# Patient Record
Sex: Male | Born: 1935 | Race: White | Hispanic: No | Marital: Married | Smoking: Former smoker
Health system: Southern US, Community
[De-identification: ages and names within clinical notes are randomized; demographics above are authoritative.]

## PROBLEM LIST (undated history)

## (undated) DIAGNOSIS — K219 Gastro-esophageal reflux disease without esophagitis: Secondary | ICD-10-CM

## (undated) DIAGNOSIS — L409 Psoriasis, unspecified: Secondary | ICD-10-CM

## (undated) DIAGNOSIS — M1990 Unspecified osteoarthritis, unspecified site: Secondary | ICD-10-CM

## (undated) DIAGNOSIS — E669 Obesity, unspecified: Secondary | ICD-10-CM

## (undated) DIAGNOSIS — R079 Chest pain, unspecified: Secondary | ICD-10-CM

## (undated) DIAGNOSIS — I10 Essential (primary) hypertension: Secondary | ICD-10-CM

## (undated) DIAGNOSIS — I200 Unstable angina: Secondary | ICD-10-CM

## (undated) DIAGNOSIS — E785 Hyperlipidemia, unspecified: Secondary | ICD-10-CM

## (undated) HISTORY — DX: Psoriasis, unspecified: L40.9

## (undated) HISTORY — DX: Chest pain, unspecified: R07.9

## (undated) HISTORY — DX: Unstable angina: I20.0

## (undated) HISTORY — DX: Hyperlipidemia, unspecified: E78.5

## (undated) HISTORY — DX: Unspecified osteoarthritis, unspecified site: M19.90

## (undated) HISTORY — PX: CHOLECYSTECTOMY: SHX55

## (undated) HISTORY — DX: Essential (primary) hypertension: I10

## (undated) HISTORY — DX: Gastro-esophageal reflux disease without esophagitis: K21.9

## (undated) HISTORY — DX: Obesity, unspecified: E66.9

---

## 1972-08-13 HISTORY — PX: HERNIA REPAIR: SHX51

## 2004-06-19 HISTORY — PX: OTHER SURGICAL HISTORY: SHX169

## 2004-06-22 ENCOUNTER — Inpatient Hospital Stay (HOSPITAL_COMMUNITY): Admission: RE | Admit: 2004-06-22 | Discharge: 2004-06-23 | Payer: Self-pay | Admitting: Cardiology

## 2004-06-22 HISTORY — PX: CARDIAC CATHETERIZATION: SHX172

## 2004-06-29 ENCOUNTER — Ambulatory Visit (HOSPITAL_COMMUNITY): Admission: RE | Admit: 2004-06-29 | Discharge: 2004-06-30 | Payer: Self-pay | Admitting: Cardiovascular Disease

## 2005-11-19 IMAGING — CR DG CHEST 2V
2 series · 2 of 2 positions shown · non-contrast
Comparison: none

CLINICAL DATA: precardiac catheterization; chest pain; hypertension
 PA AND LATERAL CHEST
 Heart size and vascularity are normal.  There are some calcified granulomas in the left lung and left hilum.  The thoracic aorta is slightly tortuous.  Small nodular densities at both bases are felt to represent nipple shadows.
 IMPRESSION
 No acute abnormality.

[view not recorded (1 of 2)]
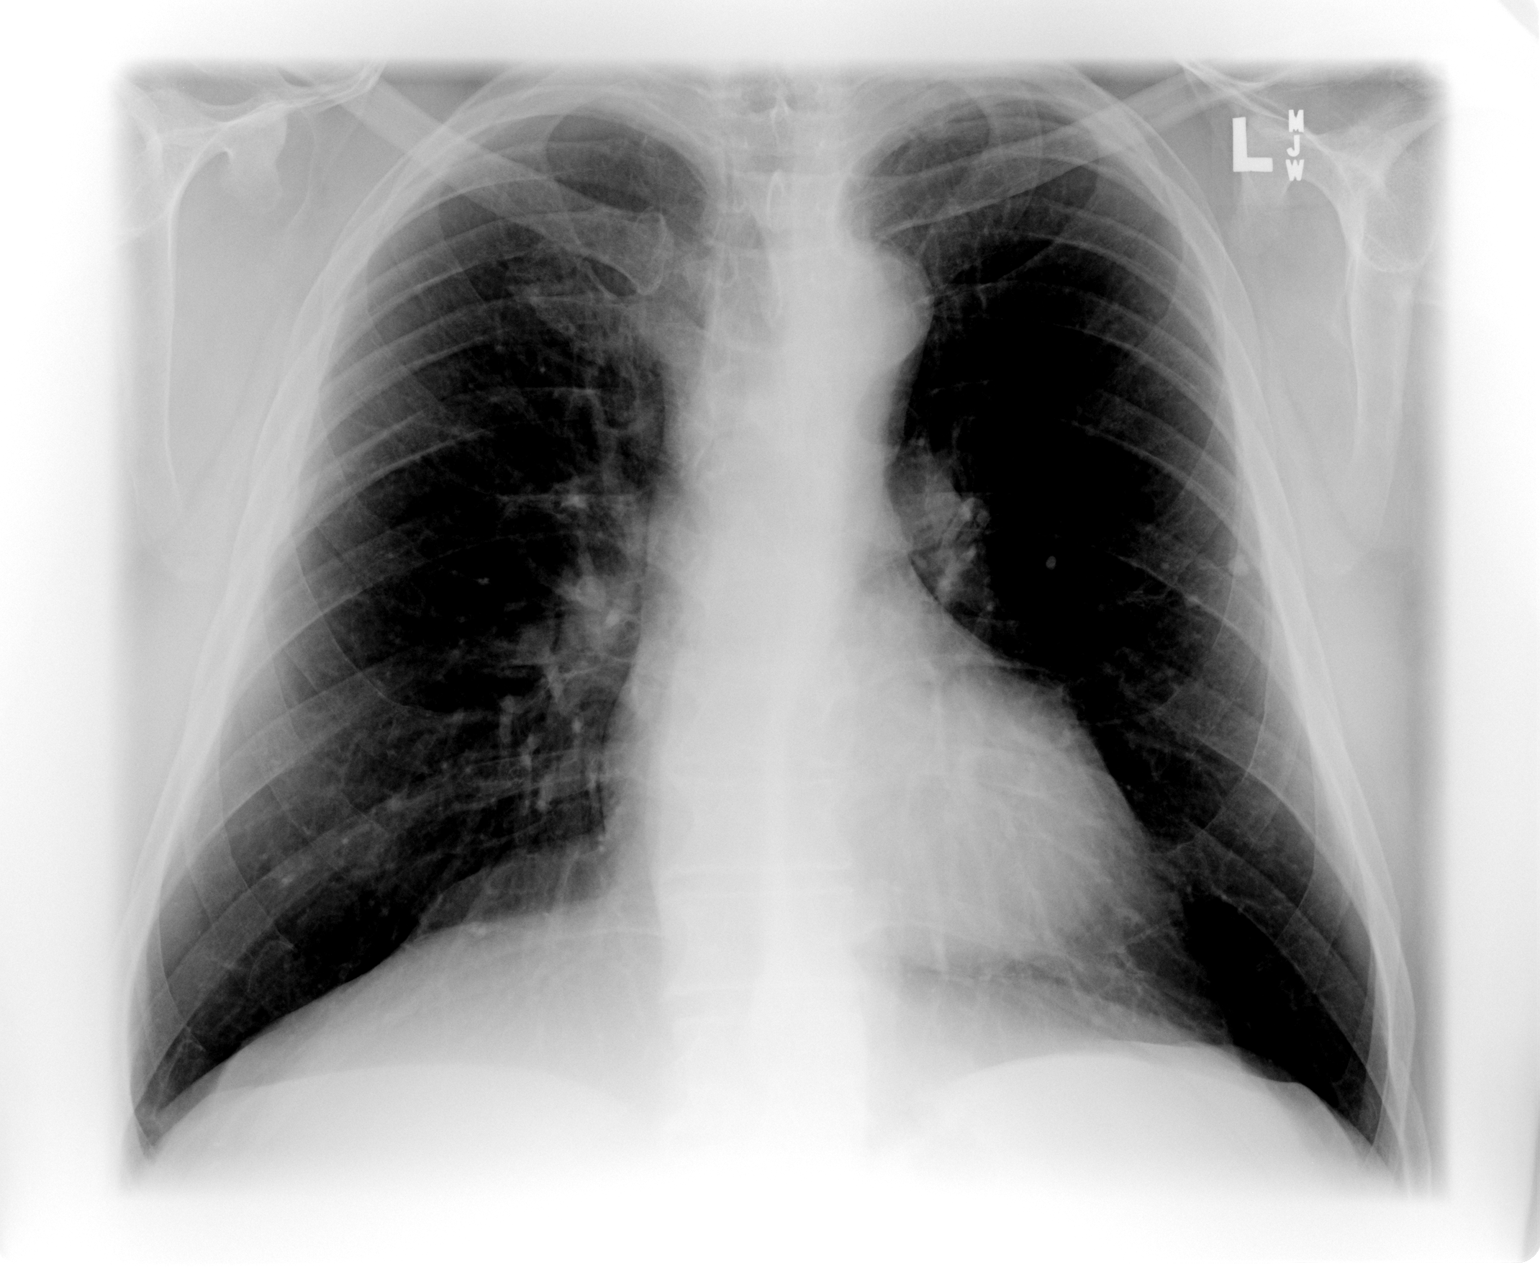

[view not recorded (2 of 2)]
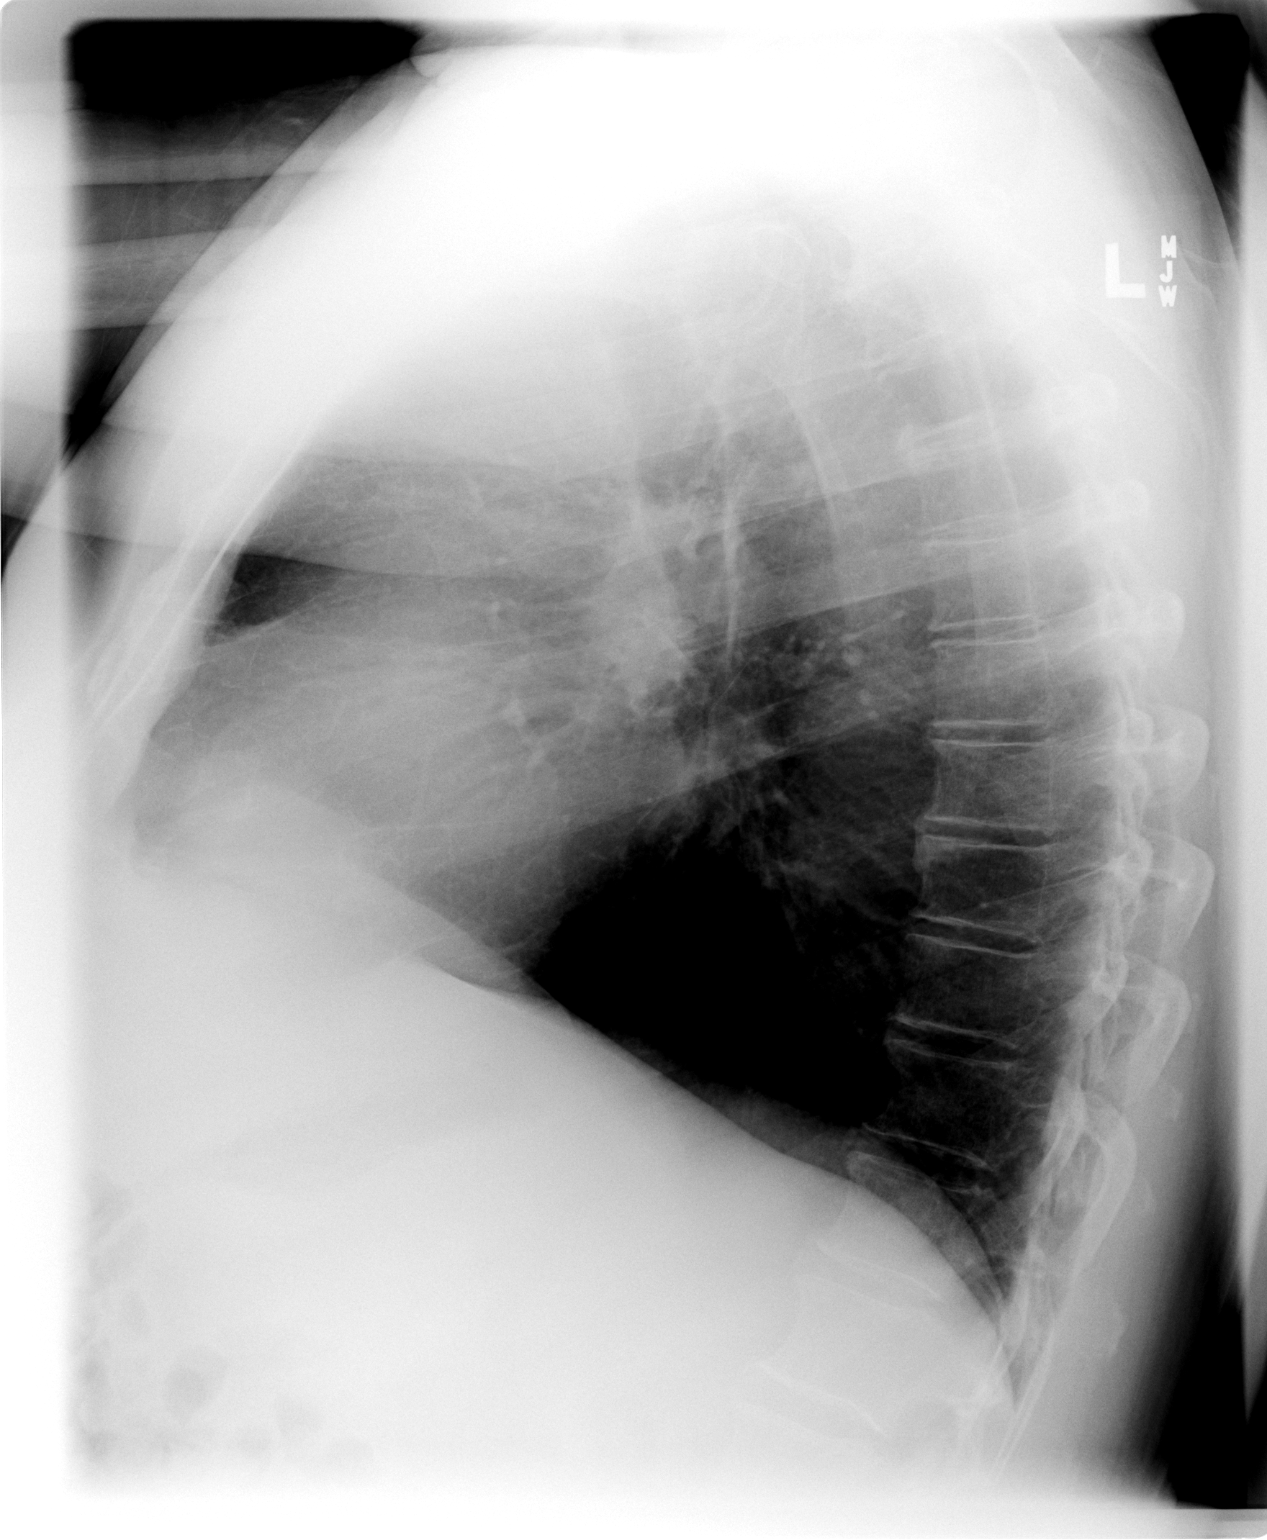

[2 of 2 positions shown; findings below may reference images not displayed]

## 2011-03-20 HISTORY — PX: DOPPLER ECHOCARDIOGRAPHY: SHX263

## 2011-08-02 HISTORY — PX: CARDIOVASCULAR STRESS TEST: SHX262

## 2011-09-17 DIAGNOSIS — D500 Iron deficiency anemia secondary to blood loss (chronic): Secondary | ICD-10-CM | POA: Diagnosis not present

## 2011-12-11 DIAGNOSIS — D500 Iron deficiency anemia secondary to blood loss (chronic): Secondary | ICD-10-CM | POA: Diagnosis not present

## 2012-01-01 DIAGNOSIS — K7689 Other specified diseases of liver: Secondary | ICD-10-CM | POA: Diagnosis not present

## 2012-01-01 DIAGNOSIS — D500 Iron deficiency anemia secondary to blood loss (chronic): Secondary | ICD-10-CM | POA: Diagnosis not present

## 2012-01-02 DIAGNOSIS — D500 Iron deficiency anemia secondary to blood loss (chronic): Secondary | ICD-10-CM | POA: Diagnosis not present

## 2012-01-04 DIAGNOSIS — K7689 Other specified diseases of liver: Secondary | ICD-10-CM | POA: Diagnosis not present

## 2012-01-24 DIAGNOSIS — Z79899 Other long term (current) drug therapy: Secondary | ICD-10-CM | POA: Diagnosis not present

## 2012-01-24 DIAGNOSIS — E782 Mixed hyperlipidemia: Secondary | ICD-10-CM | POA: Diagnosis not present

## 2012-01-24 DIAGNOSIS — R6889 Other general symptoms and signs: Secondary | ICD-10-CM | POA: Diagnosis not present

## 2012-01-24 DIAGNOSIS — R5381 Other malaise: Secondary | ICD-10-CM | POA: Diagnosis not present

## 2012-01-24 DIAGNOSIS — I2510 Atherosclerotic heart disease of native coronary artery without angina pectoris: Secondary | ICD-10-CM | POA: Diagnosis not present

## 2012-01-24 DIAGNOSIS — I719 Aortic aneurysm of unspecified site, without rupture: Secondary | ICD-10-CM | POA: Diagnosis not present

## 2012-01-24 DIAGNOSIS — I739 Peripheral vascular disease, unspecified: Secondary | ICD-10-CM | POA: Diagnosis not present

## 2012-02-18 DIAGNOSIS — I739 Peripheral vascular disease, unspecified: Secondary | ICD-10-CM | POA: Diagnosis not present

## 2012-02-18 DIAGNOSIS — I714 Abdominal aortic aneurysm, without rupture: Secondary | ICD-10-CM | POA: Diagnosis not present

## 2012-02-18 DIAGNOSIS — I2510 Atherosclerotic heart disease of native coronary artery without angina pectoris: Secondary | ICD-10-CM | POA: Diagnosis not present

## 2012-03-17 DIAGNOSIS — D500 Iron deficiency anemia secondary to blood loss (chronic): Secondary | ICD-10-CM | POA: Diagnosis not present

## 2012-03-18 DIAGNOSIS — D500 Iron deficiency anemia secondary to blood loss (chronic): Secondary | ICD-10-CM | POA: Diagnosis not present

## 2012-06-17 DIAGNOSIS — D500 Iron deficiency anemia secondary to blood loss (chronic): Secondary | ICD-10-CM | POA: Diagnosis not present

## 2012-06-20 DIAGNOSIS — K7460 Unspecified cirrhosis of liver: Secondary | ICD-10-CM | POA: Diagnosis not present

## 2012-07-01 DIAGNOSIS — R161 Splenomegaly, not elsewhere classified: Secondary | ICD-10-CM | POA: Diagnosis not present

## 2012-07-01 DIAGNOSIS — K7460 Unspecified cirrhosis of liver: Secondary | ICD-10-CM | POA: Diagnosis not present

## 2012-07-08 DIAGNOSIS — K7460 Unspecified cirrhosis of liver: Secondary | ICD-10-CM | POA: Diagnosis not present

## 2012-07-08 DIAGNOSIS — D500 Iron deficiency anemia secondary to blood loss (chronic): Secondary | ICD-10-CM | POA: Diagnosis not present

## 2012-07-08 DIAGNOSIS — R198 Other specified symptoms and signs involving the digestive system and abdomen: Secondary | ICD-10-CM | POA: Diagnosis not present

## 2012-07-29 DIAGNOSIS — I10 Essential (primary) hypertension: Secondary | ICD-10-CM | POA: Diagnosis not present

## 2012-07-29 DIAGNOSIS — E782 Mixed hyperlipidemia: Secondary | ICD-10-CM | POA: Diagnosis not present

## 2012-07-29 DIAGNOSIS — I2510 Atherosclerotic heart disease of native coronary artery without angina pectoris: Secondary | ICD-10-CM | POA: Diagnosis not present

## 2012-12-24 DIAGNOSIS — K7460 Unspecified cirrhosis of liver: Secondary | ICD-10-CM | POA: Diagnosis not present

## 2012-12-24 DIAGNOSIS — R161 Splenomegaly, not elsewhere classified: Secondary | ICD-10-CM | POA: Diagnosis not present

## 2012-12-31 DIAGNOSIS — K7460 Unspecified cirrhosis of liver: Secondary | ICD-10-CM | POA: Diagnosis not present

## 2012-12-31 DIAGNOSIS — D500 Iron deficiency anemia secondary to blood loss (chronic): Secondary | ICD-10-CM | POA: Diagnosis not present

## 2013-01-06 DIAGNOSIS — K219 Gastro-esophageal reflux disease without esophagitis: Secondary | ICD-10-CM | POA: Diagnosis not present

## 2013-01-06 DIAGNOSIS — K589 Irritable bowel syndrome without diarrhea: Secondary | ICD-10-CM | POA: Diagnosis not present

## 2013-01-06 DIAGNOSIS — D500 Iron deficiency anemia secondary to blood loss (chronic): Secondary | ICD-10-CM | POA: Diagnosis not present

## 2013-01-06 DIAGNOSIS — K7460 Unspecified cirrhosis of liver: Secondary | ICD-10-CM | POA: Diagnosis not present

## 2013-02-20 ENCOUNTER — Encounter: Payer: Self-pay | Admitting: Cardiovascular Disease

## 2013-02-20 DIAGNOSIS — E782 Mixed hyperlipidemia: Secondary | ICD-10-CM | POA: Diagnosis not present

## 2013-02-20 DIAGNOSIS — I2510 Atherosclerotic heart disease of native coronary artery without angina pectoris: Secondary | ICD-10-CM | POA: Diagnosis not present

## 2013-02-25 ENCOUNTER — Other Ambulatory Visit: Payer: Self-pay | Admitting: *Deleted

## 2013-02-25 DIAGNOSIS — R0989 Other specified symptoms and signs involving the circulatory and respiratory systems: Secondary | ICD-10-CM

## 2013-03-09 ENCOUNTER — Ambulatory Visit (HOSPITAL_COMMUNITY)
Admission: RE | Admit: 2013-03-09 | Discharge: 2013-03-09 | Disposition: A | Discharge status: Home / Self Care | Payer: Medicare Other | Source: Ambulatory Visit | Attending: Cardiovascular Disease | Admitting: Cardiovascular Disease

## 2013-03-09 DIAGNOSIS — R0989 Other specified symptoms and signs involving the circulatory and respiratory systems: Secondary | ICD-10-CM | POA: Diagnosis not present

## 2013-03-09 DIAGNOSIS — I6529 Occlusion and stenosis of unspecified carotid artery: Secondary | ICD-10-CM | POA: Diagnosis not present

## 2013-03-09 NOTE — Progress Notes (Signed)
Carotid Duplex Completed. Sherrill Mckamie, RDMS, RVT  

## 2013-07-06 DIAGNOSIS — K7460 Unspecified cirrhosis of liver: Secondary | ICD-10-CM | POA: Diagnosis not present

## 2013-07-06 DIAGNOSIS — D500 Iron deficiency anemia secondary to blood loss (chronic): Secondary | ICD-10-CM | POA: Diagnosis not present

## 2013-07-21 DIAGNOSIS — R195 Other fecal abnormalities: Secondary | ICD-10-CM | POA: Diagnosis not present

## 2013-07-21 DIAGNOSIS — K589 Irritable bowel syndrome without diarrhea: Secondary | ICD-10-CM | POA: Diagnosis not present

## 2013-07-21 DIAGNOSIS — K7460 Unspecified cirrhosis of liver: Secondary | ICD-10-CM | POA: Diagnosis not present

## 2013-07-21 DIAGNOSIS — D500 Iron deficiency anemia secondary to blood loss (chronic): Secondary | ICD-10-CM | POA: Diagnosis not present

## 2013-07-24 DIAGNOSIS — R161 Splenomegaly, not elsewhere classified: Secondary | ICD-10-CM | POA: Diagnosis not present

## 2013-07-24 DIAGNOSIS — K7460 Unspecified cirrhosis of liver: Secondary | ICD-10-CM | POA: Diagnosis not present

## 2013-09-02 DIAGNOSIS — D500 Iron deficiency anemia secondary to blood loss (chronic): Secondary | ICD-10-CM | POA: Diagnosis not present

## 2013-09-08 DIAGNOSIS — K7460 Unspecified cirrhosis of liver: Secondary | ICD-10-CM | POA: Diagnosis not present

## 2013-09-08 DIAGNOSIS — K920 Hematemesis: Secondary | ICD-10-CM | POA: Diagnosis not present

## 2013-09-08 DIAGNOSIS — I8500 Esophageal varices without bleeding: Secondary | ICD-10-CM | POA: Diagnosis not present

## 2013-09-08 DIAGNOSIS — D500 Iron deficiency anemia secondary to blood loss (chronic): Secondary | ICD-10-CM | POA: Diagnosis not present

## 2013-09-08 DIAGNOSIS — R195 Other fecal abnormalities: Secondary | ICD-10-CM | POA: Diagnosis not present

## 2013-10-05 DIAGNOSIS — I868 Varicose veins of other specified sites: Secondary | ICD-10-CM | POA: Diagnosis not present

## 2013-10-05 DIAGNOSIS — R195 Other fecal abnormalities: Secondary | ICD-10-CM | POA: Diagnosis not present

## 2013-10-05 DIAGNOSIS — I8500 Esophageal varices without bleeding: Secondary | ICD-10-CM | POA: Diagnosis not present

## 2013-10-06 DIAGNOSIS — R195 Other fecal abnormalities: Secondary | ICD-10-CM | POA: Diagnosis not present

## 2013-10-16 DIAGNOSIS — R195 Other fecal abnormalities: Secondary | ICD-10-CM | POA: Diagnosis not present

## 2013-10-19 DIAGNOSIS — R195 Other fecal abnormalities: Secondary | ICD-10-CM | POA: Diagnosis not present

## 2013-10-22 DIAGNOSIS — I8500 Esophageal varices without bleeding: Secondary | ICD-10-CM | POA: Diagnosis not present

## 2013-10-22 DIAGNOSIS — D500 Iron deficiency anemia secondary to blood loss (chronic): Secondary | ICD-10-CM | POA: Diagnosis not present

## 2013-10-22 DIAGNOSIS — R195 Other fecal abnormalities: Secondary | ICD-10-CM | POA: Diagnosis not present

## 2013-11-16 DIAGNOSIS — R195 Other fecal abnormalities: Secondary | ICD-10-CM | POA: Diagnosis not present

## 2013-11-18 DIAGNOSIS — R195 Other fecal abnormalities: Secondary | ICD-10-CM | POA: Diagnosis not present

## 2013-12-21 DIAGNOSIS — D500 Iron deficiency anemia secondary to blood loss (chronic): Secondary | ICD-10-CM | POA: Diagnosis not present

## 2013-12-21 DIAGNOSIS — R195 Other fecal abnormalities: Secondary | ICD-10-CM | POA: Diagnosis not present

## 2013-12-23 DIAGNOSIS — R195 Other fecal abnormalities: Secondary | ICD-10-CM | POA: Diagnosis not present

## 2014-01-22 DIAGNOSIS — R161 Splenomegaly, not elsewhere classified: Secondary | ICD-10-CM | POA: Diagnosis not present

## 2014-01-22 DIAGNOSIS — D500 Iron deficiency anemia secondary to blood loss (chronic): Secondary | ICD-10-CM | POA: Diagnosis not present

## 2014-01-22 DIAGNOSIS — Q619 Cystic kidney disease, unspecified: Secondary | ICD-10-CM | POA: Diagnosis not present

## 2014-01-22 DIAGNOSIS — K7460 Unspecified cirrhosis of liver: Secondary | ICD-10-CM | POA: Diagnosis not present

## 2014-01-28 DIAGNOSIS — R195 Other fecal abnormalities: Secondary | ICD-10-CM | POA: Diagnosis not present

## 2014-01-28 DIAGNOSIS — D500 Iron deficiency anemia secondary to blood loss (chronic): Secondary | ICD-10-CM | POA: Diagnosis not present

## 2014-01-29 DIAGNOSIS — R5383 Other fatigue: Secondary | ICD-10-CM | POA: Diagnosis not present

## 2014-01-29 DIAGNOSIS — D649 Anemia, unspecified: Secondary | ICD-10-CM | POA: Diagnosis not present

## 2014-01-29 DIAGNOSIS — I259 Chronic ischemic heart disease, unspecified: Secondary | ICD-10-CM | POA: Diagnosis not present

## 2014-01-29 DIAGNOSIS — R5381 Other malaise: Secondary | ICD-10-CM | POA: Diagnosis not present

## 2014-01-29 DIAGNOSIS — D539 Nutritional anemia, unspecified: Secondary | ICD-10-CM | POA: Diagnosis not present

## 2014-01-29 DIAGNOSIS — D696 Thrombocytopenia, unspecified: Secondary | ICD-10-CM | POA: Diagnosis not present

## 2014-01-29 DIAGNOSIS — Z79899 Other long term (current) drug therapy: Secondary | ICD-10-CM | POA: Diagnosis not present

## 2014-02-05 DIAGNOSIS — D510 Vitamin B12 deficiency anemia due to intrinsic factor deficiency: Secondary | ICD-10-CM | POA: Diagnosis not present

## 2014-02-11 DIAGNOSIS — D510 Vitamin B12 deficiency anemia due to intrinsic factor deficiency: Secondary | ICD-10-CM | POA: Diagnosis not present

## 2014-03-09 DIAGNOSIS — Z8249 Family history of ischemic heart disease and other diseases of the circulatory system: Secondary | ICD-10-CM | POA: Diagnosis not present

## 2014-03-09 DIAGNOSIS — I2789 Other specified pulmonary heart diseases: Secondary | ICD-10-CM | POA: Diagnosis not present

## 2014-03-09 DIAGNOSIS — L408 Other psoriasis: Secondary | ICD-10-CM | POA: Diagnosis not present

## 2014-03-09 DIAGNOSIS — K7460 Unspecified cirrhosis of liver: Secondary | ICD-10-CM | POA: Diagnosis not present

## 2014-03-09 DIAGNOSIS — I714 Abdominal aortic aneurysm, without rupture, unspecified: Secondary | ICD-10-CM | POA: Diagnosis not present

## 2014-03-09 DIAGNOSIS — I2510 Atherosclerotic heart disease of native coronary artery without angina pectoris: Secondary | ICD-10-CM | POA: Diagnosis not present

## 2014-03-09 DIAGNOSIS — I259 Chronic ischemic heart disease, unspecified: Secondary | ICD-10-CM | POA: Diagnosis not present

## 2014-03-09 DIAGNOSIS — I739 Peripheral vascular disease, unspecified: Secondary | ICD-10-CM | POA: Diagnosis not present

## 2014-03-11 DIAGNOSIS — I4891 Unspecified atrial fibrillation: Secondary | ICD-10-CM | POA: Diagnosis not present

## 2014-03-11 DIAGNOSIS — I8500 Esophageal varices without bleeding: Secondary | ICD-10-CM | POA: Diagnosis not present

## 2014-03-11 DIAGNOSIS — I2789 Other specified pulmonary heart diseases: Secondary | ICD-10-CM | POA: Diagnosis not present

## 2014-03-11 DIAGNOSIS — I2510 Atherosclerotic heart disease of native coronary artery without angina pectoris: Secondary | ICD-10-CM | POA: Diagnosis not present

## 2014-03-11 DIAGNOSIS — I259 Chronic ischemic heart disease, unspecified: Secondary | ICD-10-CM | POA: Diagnosis not present

## 2014-03-11 DIAGNOSIS — I739 Peripheral vascular disease, unspecified: Secondary | ICD-10-CM | POA: Diagnosis not present

## 2014-03-18 DIAGNOSIS — D510 Vitamin B12 deficiency anemia due to intrinsic factor deficiency: Secondary | ICD-10-CM | POA: Diagnosis not present

## 2014-03-22 DIAGNOSIS — I2789 Other specified pulmonary heart diseases: Secondary | ICD-10-CM | POA: Diagnosis not present

## 2014-03-22 DIAGNOSIS — I4891 Unspecified atrial fibrillation: Secondary | ICD-10-CM | POA: Diagnosis not present

## 2014-04-13 DIAGNOSIS — I2789 Other specified pulmonary heart diseases: Secondary | ICD-10-CM | POA: Diagnosis not present

## 2014-04-13 DIAGNOSIS — I8500 Esophageal varices without bleeding: Secondary | ICD-10-CM | POA: Diagnosis not present

## 2014-04-13 DIAGNOSIS — I4891 Unspecified atrial fibrillation: Secondary | ICD-10-CM | POA: Diagnosis not present

## 2014-04-13 DIAGNOSIS — I739 Peripheral vascular disease, unspecified: Secondary | ICD-10-CM | POA: Diagnosis not present

## 2014-04-13 DIAGNOSIS — K7460 Unspecified cirrhosis of liver: Secondary | ICD-10-CM | POA: Diagnosis not present

## 2014-04-13 DIAGNOSIS — I2510 Atherosclerotic heart disease of native coronary artery without angina pectoris: Secondary | ICD-10-CM | POA: Diagnosis not present

## 2014-04-16 DIAGNOSIS — D510 Vitamin B12 deficiency anemia due to intrinsic factor deficiency: Secondary | ICD-10-CM | POA: Diagnosis not present

## 2014-05-19 DIAGNOSIS — D509 Iron deficiency anemia, unspecified: Secondary | ICD-10-CM | POA: Diagnosis not present

## 2014-06-21 DIAGNOSIS — D509 Iron deficiency anemia, unspecified: Secondary | ICD-10-CM | POA: Diagnosis not present

## 2014-07-20 DIAGNOSIS — D509 Iron deficiency anemia, unspecified: Secondary | ICD-10-CM | POA: Diagnosis not present

## 2014-07-23 DIAGNOSIS — K7460 Unspecified cirrhosis of liver: Secondary | ICD-10-CM | POA: Diagnosis not present

## 2014-07-23 DIAGNOSIS — D509 Iron deficiency anemia, unspecified: Secondary | ICD-10-CM | POA: Diagnosis not present

## 2014-07-23 DIAGNOSIS — R161 Splenomegaly, not elsewhere classified: Secondary | ICD-10-CM | POA: Diagnosis not present

## 2014-07-27 DIAGNOSIS — D510 Vitamin B12 deficiency anemia due to intrinsic factor deficiency: Secondary | ICD-10-CM | POA: Diagnosis not present

## 2014-07-28 DIAGNOSIS — D519 Vitamin B12 deficiency anemia, unspecified: Secondary | ICD-10-CM | POA: Diagnosis not present

## 2014-07-28 DIAGNOSIS — L409 Psoriasis, unspecified: Secondary | ICD-10-CM | POA: Diagnosis not present

## 2014-07-28 DIAGNOSIS — R5383 Other fatigue: Secondary | ICD-10-CM | POA: Diagnosis not present

## 2014-07-28 DIAGNOSIS — K7460 Unspecified cirrhosis of liver: Secondary | ICD-10-CM | POA: Diagnosis not present

## 2014-07-28 DIAGNOSIS — R5381 Other malaise: Secondary | ICD-10-CM | POA: Diagnosis not present

## 2014-07-28 DIAGNOSIS — Z0000 Encounter for general adult medical examination without abnormal findings: Secondary | ICD-10-CM | POA: Diagnosis not present

## 2014-07-28 DIAGNOSIS — D509 Iron deficiency anemia, unspecified: Secondary | ICD-10-CM | POA: Diagnosis not present

## 2014-08-03 DIAGNOSIS — K219 Gastro-esophageal reflux disease without esophagitis: Secondary | ICD-10-CM | POA: Diagnosis not present

## 2014-08-03 DIAGNOSIS — D509 Iron deficiency anemia, unspecified: Secondary | ICD-10-CM | POA: Diagnosis not present

## 2014-08-03 DIAGNOSIS — K7460 Unspecified cirrhosis of liver: Secondary | ICD-10-CM | POA: Diagnosis not present

## 2014-08-03 DIAGNOSIS — K589 Irritable bowel syndrome without diarrhea: Secondary | ICD-10-CM | POA: Diagnosis not present

## 2014-08-25 DIAGNOSIS — D510 Vitamin B12 deficiency anemia due to intrinsic factor deficiency: Secondary | ICD-10-CM | POA: Diagnosis not present

## 2014-09-01 DIAGNOSIS — Z9884 Bariatric surgery status: Secondary | ICD-10-CM | POA: Diagnosis not present

## 2014-09-01 DIAGNOSIS — D62 Acute posthemorrhagic anemia: Secondary | ICD-10-CM | POA: Diagnosis not present

## 2014-09-01 DIAGNOSIS — Z86010 Personal history of colonic polyps: Secondary | ICD-10-CM | POA: Diagnosis not present

## 2014-09-01 DIAGNOSIS — Z87891 Personal history of nicotine dependence: Secondary | ICD-10-CM | POA: Diagnosis not present

## 2014-09-01 DIAGNOSIS — I4891 Unspecified atrial fibrillation: Secondary | ICD-10-CM | POA: Diagnosis not present

## 2014-09-01 DIAGNOSIS — K7290 Hepatic failure, unspecified without coma: Secondary | ICD-10-CM | POA: Diagnosis not present

## 2014-09-01 DIAGNOSIS — I8501 Esophageal varices with bleeding: Secondary | ICD-10-CM | POA: Diagnosis not present

## 2014-09-01 DIAGNOSIS — J189 Pneumonia, unspecified organism: Secondary | ICD-10-CM | POA: Diagnosis not present

## 2014-09-01 DIAGNOSIS — I8500 Esophageal varices without bleeding: Secondary | ICD-10-CM | POA: Diagnosis not present

## 2014-09-01 DIAGNOSIS — Z9861 Coronary angioplasty status: Secondary | ICD-10-CM | POA: Diagnosis not present

## 2014-09-01 DIAGNOSIS — R4182 Altered mental status, unspecified: Secondary | ICD-10-CM | POA: Diagnosis not present

## 2014-09-01 DIAGNOSIS — R531 Weakness: Secondary | ICD-10-CM | POA: Diagnosis not present

## 2014-09-01 DIAGNOSIS — K7460 Unspecified cirrhosis of liver: Secondary | ICD-10-CM | POA: Diagnosis not present

## 2014-09-01 DIAGNOSIS — R404 Transient alteration of awareness: Secondary | ICD-10-CM | POA: Diagnosis not present

## 2014-09-01 DIAGNOSIS — K7581 Nonalcoholic steatohepatitis (NASH): Secondary | ICD-10-CM | POA: Diagnosis present

## 2014-09-01 DIAGNOSIS — R55 Syncope and collapse: Secondary | ICD-10-CM | POA: Diagnosis not present

## 2014-09-01 DIAGNOSIS — I480 Paroxysmal atrial fibrillation: Secondary | ICD-10-CM | POA: Diagnosis not present

## 2014-09-01 DIAGNOSIS — Z95818 Presence of other cardiac implants and grafts: Secondary | ICD-10-CM | POA: Diagnosis not present

## 2014-09-01 DIAGNOSIS — Z0389 Encounter for observation for other suspected diseases and conditions ruled out: Secondary | ICD-10-CM | POA: Diagnosis not present

## 2014-09-01 DIAGNOSIS — L409 Psoriasis, unspecified: Secondary | ICD-10-CM | POA: Diagnosis present

## 2014-09-01 DIAGNOSIS — I10 Essential (primary) hypertension: Secondary | ICD-10-CM | POA: Diagnosis not present

## 2014-09-01 DIAGNOSIS — K922 Gastrointestinal hemorrhage, unspecified: Secondary | ICD-10-CM | POA: Diagnosis not present

## 2014-09-01 DIAGNOSIS — I2510 Atherosclerotic heart disease of native coronary artery without angina pectoris: Secondary | ICD-10-CM | POA: Diagnosis not present

## 2014-09-13 DIAGNOSIS — D649 Anemia, unspecified: Secondary | ICD-10-CM | POA: Diagnosis not present

## 2014-09-14 DIAGNOSIS — I8501 Esophageal varices with bleeding: Secondary | ICD-10-CM | POA: Diagnosis not present

## 2014-09-14 DIAGNOSIS — K7290 Hepatic failure, unspecified without coma: Secondary | ICD-10-CM | POA: Diagnosis not present

## 2014-09-14 DIAGNOSIS — K7460 Unspecified cirrhosis of liver: Secondary | ICD-10-CM | POA: Diagnosis not present

## 2014-09-20 DIAGNOSIS — Z23 Encounter for immunization: Secondary | ICD-10-CM | POA: Diagnosis not present

## 2014-09-20 DIAGNOSIS — I259 Chronic ischemic heart disease, unspecified: Secondary | ICD-10-CM | POA: Diagnosis not present

## 2014-09-20 DIAGNOSIS — I8501 Esophageal varices with bleeding: Secondary | ICD-10-CM | POA: Diagnosis not present

## 2014-09-27 DIAGNOSIS — I8501 Esophageal varices with bleeding: Secondary | ICD-10-CM | POA: Diagnosis not present

## 2014-09-27 DIAGNOSIS — D510 Vitamin B12 deficiency anemia due to intrinsic factor deficiency: Secondary | ICD-10-CM | POA: Diagnosis not present

## 2014-09-28 DIAGNOSIS — R197 Diarrhea, unspecified: Secondary | ICD-10-CM | POA: Diagnosis not present

## 2014-09-28 DIAGNOSIS — K7460 Unspecified cirrhosis of liver: Secondary | ICD-10-CM | POA: Diagnosis not present

## 2014-09-28 DIAGNOSIS — I8501 Esophageal varices with bleeding: Secondary | ICD-10-CM | POA: Diagnosis not present

## 2014-09-28 DIAGNOSIS — K7290 Hepatic failure, unspecified without coma: Secondary | ICD-10-CM | POA: Diagnosis not present

## 2014-09-28 DIAGNOSIS — D509 Iron deficiency anemia, unspecified: Secondary | ICD-10-CM | POA: Diagnosis not present

## 2014-10-25 DIAGNOSIS — D510 Vitamin B12 deficiency anemia due to intrinsic factor deficiency: Secondary | ICD-10-CM | POA: Diagnosis not present

## 2014-10-28 DIAGNOSIS — K7290 Hepatic failure, unspecified without coma: Secondary | ICD-10-CM | POA: Diagnosis not present

## 2014-10-28 DIAGNOSIS — K589 Irritable bowel syndrome without diarrhea: Secondary | ICD-10-CM | POA: Diagnosis not present

## 2014-10-28 DIAGNOSIS — D509 Iron deficiency anemia, unspecified: Secondary | ICD-10-CM | POA: Diagnosis not present

## 2014-10-28 DIAGNOSIS — K7460 Unspecified cirrhosis of liver: Secondary | ICD-10-CM | POA: Diagnosis not present

## 2014-11-23 DIAGNOSIS — D510 Vitamin B12 deficiency anemia due to intrinsic factor deficiency: Secondary | ICD-10-CM | POA: Diagnosis not present

## 2014-12-28 DIAGNOSIS — D510 Vitamin B12 deficiency anemia due to intrinsic factor deficiency: Secondary | ICD-10-CM | POA: Diagnosis not present

## 2015-01-28 DIAGNOSIS — K7460 Unspecified cirrhosis of liver: Secondary | ICD-10-CM | POA: Diagnosis not present

## 2015-01-28 DIAGNOSIS — E538 Deficiency of other specified B group vitamins: Secondary | ICD-10-CM | POA: Diagnosis not present

## 2015-01-28 DIAGNOSIS — D509 Iron deficiency anemia, unspecified: Secondary | ICD-10-CM | POA: Diagnosis not present

## 2015-02-08 DIAGNOSIS — K7460 Unspecified cirrhosis of liver: Secondary | ICD-10-CM | POA: Diagnosis not present

## 2015-02-08 DIAGNOSIS — K769 Liver disease, unspecified: Secondary | ICD-10-CM | POA: Diagnosis not present

## 2015-02-08 DIAGNOSIS — R161 Splenomegaly, not elsewhere classified: Secondary | ICD-10-CM | POA: Diagnosis not present

## 2015-02-15 DIAGNOSIS — D509 Iron deficiency anemia, unspecified: Secondary | ICD-10-CM | POA: Diagnosis not present

## 2015-02-15 DIAGNOSIS — K7460 Unspecified cirrhosis of liver: Secondary | ICD-10-CM | POA: Diagnosis not present

## 2015-02-15 DIAGNOSIS — R195 Other fecal abnormalities: Secondary | ICD-10-CM | POA: Diagnosis not present

## 2015-02-24 DIAGNOSIS — D510 Vitamin B12 deficiency anemia due to intrinsic factor deficiency: Secondary | ICD-10-CM | POA: Diagnosis not present

## 2015-03-29 ENCOUNTER — Encounter: Payer: Self-pay | Admitting: Cardiovascular Disease

## 2015-03-31 DIAGNOSIS — D510 Vitamin B12 deficiency anemia due to intrinsic factor deficiency: Secondary | ICD-10-CM | POA: Diagnosis not present

## 2015-04-14 ENCOUNTER — Encounter: Payer: Self-pay | Admitting: Cardiovascular Disease

## 2015-04-19 DIAGNOSIS — I4891 Unspecified atrial fibrillation: Secondary | ICD-10-CM | POA: Diagnosis not present

## 2015-04-19 DIAGNOSIS — Z1389 Encounter for screening for other disorder: Secondary | ICD-10-CM | POA: Diagnosis not present

## 2015-04-29 DIAGNOSIS — D510 Vitamin B12 deficiency anemia due to intrinsic factor deficiency: Secondary | ICD-10-CM | POA: Diagnosis not present

## 2015-05-24 DIAGNOSIS — I739 Peripheral vascular disease, unspecified: Secondary | ICD-10-CM | POA: Diagnosis not present

## 2015-05-24 DIAGNOSIS — K745 Biliary cirrhosis, unspecified: Secondary | ICD-10-CM | POA: Diagnosis not present

## 2015-05-24 DIAGNOSIS — I482 Chronic atrial fibrillation: Secondary | ICD-10-CM | POA: Diagnosis not present

## 2015-05-24 DIAGNOSIS — I2510 Atherosclerotic heart disease of native coronary artery without angina pectoris: Secondary | ICD-10-CM | POA: Insufficient documentation

## 2015-05-24 DIAGNOSIS — D510 Vitamin B12 deficiency anemia due to intrinsic factor deficiency: Secondary | ICD-10-CM | POA: Diagnosis not present

## 2015-06-01 DIAGNOSIS — I739 Peripheral vascular disease, unspecified: Secondary | ICD-10-CM | POA: Diagnosis not present

## 2015-06-01 DIAGNOSIS — I2510 Atherosclerotic heart disease of native coronary artery without angina pectoris: Secondary | ICD-10-CM | POA: Diagnosis not present

## 2015-06-01 DIAGNOSIS — I482 Chronic atrial fibrillation: Secondary | ICD-10-CM | POA: Diagnosis not present

## 2015-06-27 DIAGNOSIS — D510 Vitamin B12 deficiency anemia due to intrinsic factor deficiency: Secondary | ICD-10-CM | POA: Diagnosis not present

## 2015-07-11 DIAGNOSIS — K7460 Unspecified cirrhosis of liver: Secondary | ICD-10-CM | POA: Diagnosis not present

## 2015-07-11 DIAGNOSIS — D509 Iron deficiency anemia, unspecified: Secondary | ICD-10-CM | POA: Diagnosis not present

## 2015-07-28 DIAGNOSIS — K7460 Unspecified cirrhosis of liver: Secondary | ICD-10-CM | POA: Diagnosis not present

## 2015-07-28 DIAGNOSIS — D509 Iron deficiency anemia, unspecified: Secondary | ICD-10-CM | POA: Diagnosis not present

## 2015-07-28 DIAGNOSIS — R195 Other fecal abnormalities: Secondary | ICD-10-CM | POA: Diagnosis not present

## 2015-07-28 DIAGNOSIS — D510 Vitamin B12 deficiency anemia due to intrinsic factor deficiency: Secondary | ICD-10-CM | POA: Diagnosis not present

## 2015-07-28 DIAGNOSIS — I8500 Esophageal varices without bleeding: Secondary | ICD-10-CM | POA: Diagnosis not present

## 2015-08-02 DIAGNOSIS — R161 Splenomegaly, not elsewhere classified: Secondary | ICD-10-CM | POA: Diagnosis not present

## 2015-08-02 DIAGNOSIS — K7460 Unspecified cirrhosis of liver: Secondary | ICD-10-CM | POA: Diagnosis not present

## 2015-08-25 DIAGNOSIS — Z79899 Other long term (current) drug therapy: Secondary | ICD-10-CM | POA: Diagnosis not present

## 2015-08-31 DIAGNOSIS — Z23 Encounter for immunization: Secondary | ICD-10-CM | POA: Diagnosis not present

## 2015-08-31 DIAGNOSIS — Z79899 Other long term (current) drug therapy: Secondary | ICD-10-CM | POA: Diagnosis not present

## 2015-08-31 DIAGNOSIS — D510 Vitamin B12 deficiency anemia due to intrinsic factor deficiency: Secondary | ICD-10-CM | POA: Diagnosis not present

## 2015-08-31 DIAGNOSIS — K7460 Unspecified cirrhosis of liver: Secondary | ICD-10-CM | POA: Diagnosis not present

## 2015-08-31 DIAGNOSIS — I4891 Unspecified atrial fibrillation: Secondary | ICD-10-CM | POA: Diagnosis not present

## 2015-08-31 DIAGNOSIS — D509 Iron deficiency anemia, unspecified: Secondary | ICD-10-CM | POA: Diagnosis not present

## 2015-08-31 DIAGNOSIS — Z0000 Encounter for general adult medical examination without abnormal findings: Secondary | ICD-10-CM | POA: Diagnosis not present

## 2015-10-14 DIAGNOSIS — D510 Vitamin B12 deficiency anemia due to intrinsic factor deficiency: Secondary | ICD-10-CM | POA: Diagnosis not present

## 2015-10-20 DIAGNOSIS — R195 Other fecal abnormalities: Secondary | ICD-10-CM | POA: Diagnosis not present

## 2015-11-01 DIAGNOSIS — R195 Other fecal abnormalities: Secondary | ICD-10-CM | POA: Diagnosis not present

## 2015-11-01 DIAGNOSIS — R197 Diarrhea, unspecified: Secondary | ICD-10-CM | POA: Diagnosis not present

## 2015-11-01 DIAGNOSIS — K219 Gastro-esophageal reflux disease without esophagitis: Secondary | ICD-10-CM | POA: Diagnosis not present

## 2015-11-01 DIAGNOSIS — D509 Iron deficiency anemia, unspecified: Secondary | ICD-10-CM | POA: Diagnosis not present

## 2015-11-01 DIAGNOSIS — K7460 Unspecified cirrhosis of liver: Secondary | ICD-10-CM | POA: Diagnosis not present

## 2015-11-17 DIAGNOSIS — A047 Enterocolitis due to Clostridium difficile: Secondary | ICD-10-CM | POA: Diagnosis not present

## 2015-11-22 DIAGNOSIS — D510 Vitamin B12 deficiency anemia due to intrinsic factor deficiency: Secondary | ICD-10-CM | POA: Diagnosis not present

## 2015-11-30 DIAGNOSIS — K745 Biliary cirrhosis, unspecified: Secondary | ICD-10-CM | POA: Diagnosis not present

## 2015-11-30 DIAGNOSIS — I2510 Atherosclerotic heart disease of native coronary artery without angina pectoris: Secondary | ICD-10-CM | POA: Diagnosis not present

## 2015-11-30 DIAGNOSIS — I482 Chronic atrial fibrillation: Secondary | ICD-10-CM | POA: Diagnosis not present

## 2015-11-30 DIAGNOSIS — I739 Peripheral vascular disease, unspecified: Secondary | ICD-10-CM | POA: Diagnosis not present

## 2015-12-26 DIAGNOSIS — K7460 Unspecified cirrhosis of liver: Secondary | ICD-10-CM | POA: Diagnosis not present

## 2016-01-25 DIAGNOSIS — D510 Vitamin B12 deficiency anemia due to intrinsic factor deficiency: Secondary | ICD-10-CM | POA: Diagnosis not present

## 2016-01-25 DIAGNOSIS — K7460 Unspecified cirrhosis of liver: Secondary | ICD-10-CM | POA: Diagnosis not present

## 2016-02-09 DIAGNOSIS — R739 Hyperglycemia, unspecified: Secondary | ICD-10-CM | POA: Diagnosis not present

## 2016-02-09 DIAGNOSIS — K7460 Unspecified cirrhosis of liver: Secondary | ICD-10-CM | POA: Diagnosis not present

## 2016-02-21 DIAGNOSIS — D510 Vitamin B12 deficiency anemia due to intrinsic factor deficiency: Secondary | ICD-10-CM | POA: Diagnosis not present

## 2016-02-24 DIAGNOSIS — R739 Hyperglycemia, unspecified: Secondary | ICD-10-CM | POA: Diagnosis not present

## 2016-02-24 DIAGNOSIS — I4891 Unspecified atrial fibrillation: Secondary | ICD-10-CM | POA: Diagnosis not present

## 2016-02-24 DIAGNOSIS — Z9181 History of falling: Secondary | ICD-10-CM | POA: Diagnosis not present

## 2016-03-20 DIAGNOSIS — D510 Vitamin B12 deficiency anemia due to intrinsic factor deficiency: Secondary | ICD-10-CM | POA: Diagnosis not present

## 2016-04-24 DIAGNOSIS — D510 Vitamin B12 deficiency anemia due to intrinsic factor deficiency: Secondary | ICD-10-CM | POA: Diagnosis not present

## 2016-04-24 DIAGNOSIS — Z0000 Encounter for general adult medical examination without abnormal findings: Secondary | ICD-10-CM | POA: Diagnosis not present

## 2016-05-22 DIAGNOSIS — D510 Vitamin B12 deficiency anemia due to intrinsic factor deficiency: Secondary | ICD-10-CM | POA: Diagnosis not present

## 2016-05-31 DIAGNOSIS — I482 Chronic atrial fibrillation: Secondary | ICD-10-CM | POA: Diagnosis not present

## 2016-05-31 DIAGNOSIS — I739 Peripheral vascular disease, unspecified: Secondary | ICD-10-CM | POA: Diagnosis not present

## 2016-05-31 DIAGNOSIS — I2510 Atherosclerotic heart disease of native coronary artery without angina pectoris: Secondary | ICD-10-CM | POA: Diagnosis not present

## 2016-05-31 DIAGNOSIS — K745 Biliary cirrhosis, unspecified: Secondary | ICD-10-CM | POA: Diagnosis not present

## 2016-06-06 DIAGNOSIS — I482 Chronic atrial fibrillation: Secondary | ICD-10-CM | POA: Diagnosis not present

## 2016-06-06 DIAGNOSIS — K745 Biliary cirrhosis, unspecified: Secondary | ICD-10-CM | POA: Diagnosis not present

## 2016-06-06 DIAGNOSIS — I2510 Atherosclerotic heart disease of native coronary artery without angina pectoris: Secondary | ICD-10-CM | POA: Diagnosis not present

## 2016-06-06 DIAGNOSIS — I739 Peripheral vascular disease, unspecified: Secondary | ICD-10-CM | POA: Diagnosis not present

## 2016-06-22 DIAGNOSIS — D510 Vitamin B12 deficiency anemia due to intrinsic factor deficiency: Secondary | ICD-10-CM | POA: Diagnosis not present

## 2016-07-17 DIAGNOSIS — K7460 Unspecified cirrhosis of liver: Secondary | ICD-10-CM | POA: Diagnosis not present

## 2016-07-23 DIAGNOSIS — K7460 Unspecified cirrhosis of liver: Secondary | ICD-10-CM | POA: Diagnosis not present

## 2016-07-23 DIAGNOSIS — D510 Vitamin B12 deficiency anemia due to intrinsic factor deficiency: Secondary | ICD-10-CM | POA: Diagnosis not present

## 2016-07-26 DIAGNOSIS — D649 Anemia, unspecified: Secondary | ICD-10-CM | POA: Diagnosis not present

## 2016-07-26 DIAGNOSIS — K7460 Unspecified cirrhosis of liver: Secondary | ICD-10-CM | POA: Diagnosis not present

## 2016-07-27 DIAGNOSIS — D649 Anemia, unspecified: Secondary | ICD-10-CM | POA: Diagnosis not present

## 2016-08-10 DIAGNOSIS — I8510 Secondary esophageal varices without bleeding: Secondary | ICD-10-CM | POA: Diagnosis not present

## 2016-08-10 DIAGNOSIS — K7460 Unspecified cirrhosis of liver: Secondary | ICD-10-CM | POA: Diagnosis not present

## 2016-08-10 DIAGNOSIS — I8500 Esophageal varices without bleeding: Secondary | ICD-10-CM | POA: Diagnosis not present

## 2016-08-10 DIAGNOSIS — D500 Iron deficiency anemia secondary to blood loss (chronic): Secondary | ICD-10-CM | POA: Diagnosis not present

## 2016-08-10 DIAGNOSIS — K3189 Other diseases of stomach and duodenum: Secondary | ICD-10-CM | POA: Diagnosis not present

## 2016-08-31 DIAGNOSIS — D510 Vitamin B12 deficiency anemia due to intrinsic factor deficiency: Secondary | ICD-10-CM | POA: Diagnosis not present

## 2016-09-04 DIAGNOSIS — D509 Iron deficiency anemia, unspecified: Secondary | ICD-10-CM | POA: Diagnosis not present

## 2016-09-07 DIAGNOSIS — Z1389 Encounter for screening for other disorder: Secondary | ICD-10-CM | POA: Diagnosis not present

## 2016-09-07 DIAGNOSIS — I259 Chronic ischemic heart disease, unspecified: Secondary | ICD-10-CM | POA: Diagnosis not present

## 2016-09-07 DIAGNOSIS — Z0000 Encounter for general adult medical examination without abnormal findings: Secondary | ICD-10-CM | POA: Diagnosis not present

## 2016-09-07 DIAGNOSIS — D510 Vitamin B12 deficiency anemia due to intrinsic factor deficiency: Secondary | ICD-10-CM | POA: Diagnosis not present

## 2016-09-07 DIAGNOSIS — I4891 Unspecified atrial fibrillation: Secondary | ICD-10-CM | POA: Diagnosis not present

## 2016-09-13 DIAGNOSIS — D509 Iron deficiency anemia, unspecified: Secondary | ICD-10-CM | POA: Diagnosis not present

## 2016-09-13 DIAGNOSIS — Z1212 Encounter for screening for malignant neoplasm of rectum: Secondary | ICD-10-CM | POA: Diagnosis not present

## 2016-09-24 DIAGNOSIS — E538 Deficiency of other specified B group vitamins: Secondary | ICD-10-CM | POA: Diagnosis not present

## 2016-10-15 DIAGNOSIS — D509 Iron deficiency anemia, unspecified: Secondary | ICD-10-CM | POA: Diagnosis not present

## 2016-11-13 DIAGNOSIS — D509 Iron deficiency anemia, unspecified: Secondary | ICD-10-CM | POA: Diagnosis not present

## 2017-01-03 DIAGNOSIS — I739 Peripheral vascular disease, unspecified: Secondary | ICD-10-CM | POA: Diagnosis not present

## 2017-01-03 DIAGNOSIS — K745 Biliary cirrhosis, unspecified: Secondary | ICD-10-CM | POA: Diagnosis not present

## 2017-01-03 DIAGNOSIS — I482 Chronic atrial fibrillation: Secondary | ICD-10-CM | POA: Diagnosis not present

## 2017-01-03 DIAGNOSIS — I2510 Atherosclerotic heart disease of native coronary artery without angina pectoris: Secondary | ICD-10-CM | POA: Diagnosis not present

## 2017-01-18 DIAGNOSIS — D510 Vitamin B12 deficiency anemia due to intrinsic factor deficiency: Secondary | ICD-10-CM | POA: Diagnosis not present

## 2017-01-24 DIAGNOSIS — N200 Calculus of kidney: Secondary | ICD-10-CM | POA: Diagnosis not present

## 2017-01-24 DIAGNOSIS — K7460 Unspecified cirrhosis of liver: Secondary | ICD-10-CM | POA: Diagnosis not present

## 2017-01-29 DIAGNOSIS — D509 Iron deficiency anemia, unspecified: Secondary | ICD-10-CM | POA: Diagnosis not present

## 2017-01-29 DIAGNOSIS — K7460 Unspecified cirrhosis of liver: Secondary | ICD-10-CM | POA: Diagnosis not present

## 2017-01-31 DIAGNOSIS — K7460 Unspecified cirrhosis of liver: Secondary | ICD-10-CM | POA: Diagnosis not present

## 2017-01-31 DIAGNOSIS — K763 Infarction of liver: Secondary | ICD-10-CM | POA: Diagnosis not present

## 2017-01-31 DIAGNOSIS — D509 Iron deficiency anemia, unspecified: Secondary | ICD-10-CM | POA: Diagnosis not present

## 2017-01-31 DIAGNOSIS — K219 Gastro-esophageal reflux disease without esophagitis: Secondary | ICD-10-CM | POA: Diagnosis not present

## 2017-02-20 DIAGNOSIS — D510 Vitamin B12 deficiency anemia due to intrinsic factor deficiency: Secondary | ICD-10-CM | POA: Diagnosis not present

## 2017-04-04 DIAGNOSIS — D510 Vitamin B12 deficiency anemia due to intrinsic factor deficiency: Secondary | ICD-10-CM | POA: Diagnosis not present

## 2017-05-20 DIAGNOSIS — Z23 Encounter for immunization: Secondary | ICD-10-CM | POA: Diagnosis not present

## 2017-05-20 DIAGNOSIS — D510 Vitamin B12 deficiency anemia due to intrinsic factor deficiency: Secondary | ICD-10-CM | POA: Diagnosis not present

## 2017-06-19 DIAGNOSIS — I4891 Unspecified atrial fibrillation: Secondary | ICD-10-CM | POA: Insufficient documentation

## 2017-06-20 DIAGNOSIS — I6523 Occlusion and stenosis of bilateral carotid arteries: Secondary | ICD-10-CM | POA: Insufficient documentation

## 2017-06-20 DIAGNOSIS — K7460 Unspecified cirrhosis of liver: Secondary | ICD-10-CM | POA: Insufficient documentation

## 2017-06-21 DIAGNOSIS — D510 Vitamin B12 deficiency anemia due to intrinsic factor deficiency: Secondary | ICD-10-CM | POA: Diagnosis not present

## 2017-07-29 DIAGNOSIS — Z6828 Body mass index (BMI) 28.0-28.9, adult: Secondary | ICD-10-CM | POA: Diagnosis not present

## 2017-07-29 DIAGNOSIS — L03119 Cellulitis of unspecified part of limb: Secondary | ICD-10-CM | POA: Diagnosis not present

## 2017-07-29 DIAGNOSIS — D510 Vitamin B12 deficiency anemia due to intrinsic factor deficiency: Secondary | ICD-10-CM | POA: Diagnosis not present

## 2017-08-02 DIAGNOSIS — K7460 Unspecified cirrhosis of liver: Secondary | ICD-10-CM | POA: Diagnosis not present

## 2017-08-02 DIAGNOSIS — R161 Splenomegaly, not elsewhere classified: Secondary | ICD-10-CM | POA: Diagnosis not present

## 2017-08-02 DIAGNOSIS — D509 Iron deficiency anemia, unspecified: Secondary | ICD-10-CM | POA: Diagnosis not present

## 2017-08-07 DIAGNOSIS — K766 Portal hypertension: Secondary | ICD-10-CM | POA: Diagnosis not present

## 2017-08-07 DIAGNOSIS — K7460 Unspecified cirrhosis of liver: Secondary | ICD-10-CM | POA: Diagnosis not present

## 2017-08-07 DIAGNOSIS — K219 Gastro-esophageal reflux disease without esophagitis: Secondary | ICD-10-CM | POA: Diagnosis not present

## 2017-08-07 DIAGNOSIS — I8500 Esophageal varices without bleeding: Secondary | ICD-10-CM | POA: Diagnosis not present

## 2017-08-07 DIAGNOSIS — D509 Iron deficiency anemia, unspecified: Secondary | ICD-10-CM | POA: Diagnosis not present

## 2017-09-10 DIAGNOSIS — E785 Hyperlipidemia, unspecified: Secondary | ICD-10-CM | POA: Diagnosis not present

## 2017-09-10 DIAGNOSIS — I4891 Unspecified atrial fibrillation: Secondary | ICD-10-CM | POA: Diagnosis not present

## 2017-09-10 DIAGNOSIS — D510 Vitamin B12 deficiency anemia due to intrinsic factor deficiency: Secondary | ICD-10-CM | POA: Diagnosis not present

## 2017-09-10 DIAGNOSIS — K7460 Unspecified cirrhosis of liver: Secondary | ICD-10-CM | POA: Diagnosis not present

## 2017-09-10 DIAGNOSIS — Z0000 Encounter for general adult medical examination without abnormal findings: Secondary | ICD-10-CM | POA: Diagnosis not present

## 2017-09-10 DIAGNOSIS — D509 Iron deficiency anemia, unspecified: Secondary | ICD-10-CM | POA: Diagnosis not present

## 2017-09-10 DIAGNOSIS — Z6827 Body mass index (BMI) 27.0-27.9, adult: Secondary | ICD-10-CM | POA: Diagnosis not present

## 2017-09-10 DIAGNOSIS — Z79899 Other long term (current) drug therapy: Secondary | ICD-10-CM | POA: Diagnosis not present

## 2017-09-10 DIAGNOSIS — Z1331 Encounter for screening for depression: Secondary | ICD-10-CM | POA: Diagnosis not present

## 2017-09-10 DIAGNOSIS — Z9181 History of falling: Secondary | ICD-10-CM | POA: Diagnosis not present

## 2017-09-10 DIAGNOSIS — Z1339 Encounter for screening examination for other mental health and behavioral disorders: Secondary | ICD-10-CM | POA: Diagnosis not present

## 2017-10-24 DIAGNOSIS — D510 Vitamin B12 deficiency anemia due to intrinsic factor deficiency: Secondary | ICD-10-CM | POA: Diagnosis not present

## 2017-11-21 DIAGNOSIS — D510 Vitamin B12 deficiency anemia due to intrinsic factor deficiency: Secondary | ICD-10-CM | POA: Diagnosis not present

## 2017-12-18 DIAGNOSIS — K7460 Unspecified cirrhosis of liver: Secondary | ICD-10-CM | POA: Diagnosis not present

## 2017-12-18 DIAGNOSIS — I482 Chronic atrial fibrillation: Secondary | ICD-10-CM | POA: Diagnosis not present

## 2017-12-18 DIAGNOSIS — I2510 Atherosclerotic heart disease of native coronary artery without angina pectoris: Secondary | ICD-10-CM | POA: Diagnosis not present

## 2017-12-18 DIAGNOSIS — I6523 Occlusion and stenosis of bilateral carotid arteries: Secondary | ICD-10-CM | POA: Diagnosis not present

## 2017-12-26 DIAGNOSIS — D510 Vitamin B12 deficiency anemia due to intrinsic factor deficiency: Secondary | ICD-10-CM | POA: Diagnosis not present

## 2018-01-22 DIAGNOSIS — D510 Vitamin B12 deficiency anemia due to intrinsic factor deficiency: Secondary | ICD-10-CM | POA: Diagnosis not present

## 2018-01-22 DIAGNOSIS — D509 Iron deficiency anemia, unspecified: Secondary | ICD-10-CM | POA: Diagnosis not present

## 2018-01-22 DIAGNOSIS — K7460 Unspecified cirrhosis of liver: Secondary | ICD-10-CM | POA: Diagnosis not present

## 2018-01-22 DIAGNOSIS — R161 Splenomegaly, not elsewhere classified: Secondary | ICD-10-CM | POA: Diagnosis not present

## 2018-02-04 DIAGNOSIS — Z86010 Personal history of colonic polyps: Secondary | ICD-10-CM | POA: Diagnosis not present

## 2018-02-04 DIAGNOSIS — R195 Other fecal abnormalities: Secondary | ICD-10-CM | POA: Diagnosis not present

## 2018-02-04 DIAGNOSIS — K7460 Unspecified cirrhosis of liver: Secondary | ICD-10-CM | POA: Diagnosis not present

## 2018-02-04 DIAGNOSIS — K766 Portal hypertension: Secondary | ICD-10-CM | POA: Diagnosis not present

## 2018-02-04 DIAGNOSIS — D509 Iron deficiency anemia, unspecified: Secondary | ICD-10-CM | POA: Diagnosis not present

## 2018-02-07 DIAGNOSIS — D126 Benign neoplasm of colon, unspecified: Secondary | ICD-10-CM | POA: Diagnosis not present

## 2018-02-07 DIAGNOSIS — Z86010 Personal history of colonic polyps: Secondary | ICD-10-CM | POA: Diagnosis not present

## 2018-02-07 DIAGNOSIS — R195 Other fecal abnormalities: Secondary | ICD-10-CM | POA: Diagnosis not present

## 2018-02-07 DIAGNOSIS — Z800 Family history of malignant neoplasm of digestive organs: Secondary | ICD-10-CM | POA: Diagnosis not present

## 2018-02-07 DIAGNOSIS — K635 Polyp of colon: Secondary | ICD-10-CM | POA: Diagnosis not present

## 2018-02-07 DIAGNOSIS — D122 Benign neoplasm of ascending colon: Secondary | ICD-10-CM | POA: Diagnosis not present

## 2018-02-07 DIAGNOSIS — D128 Benign neoplasm of rectum: Secondary | ICD-10-CM | POA: Diagnosis not present

## 2018-02-24 DIAGNOSIS — D510 Vitamin B12 deficiency anemia due to intrinsic factor deficiency: Secondary | ICD-10-CM | POA: Diagnosis not present

## 2018-03-24 DIAGNOSIS — D510 Vitamin B12 deficiency anemia due to intrinsic factor deficiency: Secondary | ICD-10-CM | POA: Diagnosis not present

## 2018-04-22 DIAGNOSIS — D510 Vitamin B12 deficiency anemia due to intrinsic factor deficiency: Secondary | ICD-10-CM | POA: Diagnosis not present

## 2018-05-02 DIAGNOSIS — S199XXA Unspecified injury of neck, initial encounter: Secondary | ICD-10-CM | POA: Diagnosis not present

## 2018-05-02 DIAGNOSIS — N390 Urinary tract infection, site not specified: Secondary | ICD-10-CM | POA: Diagnosis not present

## 2018-05-02 DIAGNOSIS — R262 Difficulty in walking, not elsewhere classified: Secondary | ICD-10-CM | POA: Diagnosis not present

## 2018-05-02 DIAGNOSIS — B9689 Other specified bacterial agents as the cause of diseases classified elsewhere: Secondary | ICD-10-CM | POA: Diagnosis not present

## 2018-05-02 DIAGNOSIS — K7290 Hepatic failure, unspecified without coma: Secondary | ICD-10-CM | POA: Diagnosis not present

## 2018-05-02 DIAGNOSIS — S0990XA Unspecified injury of head, initial encounter: Secondary | ICD-10-CM | POA: Diagnosis not present

## 2018-05-03 DIAGNOSIS — I6340 Cerebral infarction due to embolism of unspecified cerebral artery: Secondary | ICD-10-CM | POA: Diagnosis not present

## 2018-05-03 DIAGNOSIS — R410 Disorientation, unspecified: Secondary | ICD-10-CM | POA: Diagnosis not present

## 2018-05-03 DIAGNOSIS — I63233 Cerebral infarction due to unspecified occlusion or stenosis of bilateral carotid arteries: Secondary | ICD-10-CM | POA: Diagnosis not present

## 2018-05-03 DIAGNOSIS — I10 Essential (primary) hypertension: Secondary | ICD-10-CM | POA: Diagnosis not present

## 2018-05-03 DIAGNOSIS — R531 Weakness: Secondary | ICD-10-CM | POA: Diagnosis not present

## 2018-05-03 DIAGNOSIS — R29706 NIHSS score 6: Secondary | ICD-10-CM | POA: Diagnosis present

## 2018-05-03 DIAGNOSIS — R404 Transient alteration of awareness: Secondary | ICD-10-CM | POA: Diagnosis not present

## 2018-05-03 DIAGNOSIS — K7469 Other cirrhosis of liver: Secondary | ICD-10-CM | POA: Diagnosis present

## 2018-05-03 DIAGNOSIS — G9341 Metabolic encephalopathy: Secondary | ICD-10-CM | POA: Diagnosis present

## 2018-05-03 DIAGNOSIS — Z9181 History of falling: Secondary | ICD-10-CM | POA: Diagnosis not present

## 2018-05-03 DIAGNOSIS — B9689 Other specified bacterial agents as the cause of diseases classified elsewhere: Secondary | ICD-10-CM | POA: Diagnosis not present

## 2018-05-03 DIAGNOSIS — I6523 Occlusion and stenosis of bilateral carotid arteries: Secondary | ICD-10-CM | POA: Diagnosis not present

## 2018-05-03 DIAGNOSIS — Z7401 Bed confinement status: Secondary | ICD-10-CM | POA: Diagnosis not present

## 2018-05-03 DIAGNOSIS — Z7982 Long term (current) use of aspirin: Secondary | ICD-10-CM | POA: Diagnosis not present

## 2018-05-03 DIAGNOSIS — W19XXXA Unspecified fall, initial encounter: Secondary | ICD-10-CM | POA: Diagnosis not present

## 2018-05-03 DIAGNOSIS — M2550 Pain in unspecified joint: Secondary | ICD-10-CM | POA: Diagnosis not present

## 2018-05-03 DIAGNOSIS — I482 Chronic atrial fibrillation: Secondary | ICD-10-CM | POA: Diagnosis present

## 2018-05-03 DIAGNOSIS — I639 Cerebral infarction, unspecified: Secondary | ICD-10-CM | POA: Diagnosis not present

## 2018-05-03 DIAGNOSIS — I480 Paroxysmal atrial fibrillation: Secondary | ICD-10-CM | POA: Diagnosis not present

## 2018-05-03 DIAGNOSIS — S0191XA Laceration without foreign body of unspecified part of head, initial encounter: Secondary | ICD-10-CM | POA: Diagnosis present

## 2018-05-03 DIAGNOSIS — I2510 Atherosclerotic heart disease of native coronary artery without angina pectoris: Secondary | ICD-10-CM | POA: Diagnosis present

## 2018-05-03 DIAGNOSIS — D638 Anemia in other chronic diseases classified elsewhere: Secondary | ICD-10-CM | POA: Diagnosis present

## 2018-05-03 DIAGNOSIS — R2689 Other abnormalities of gait and mobility: Secondary | ICD-10-CM | POA: Diagnosis not present

## 2018-05-03 DIAGNOSIS — I63411 Cerebral infarction due to embolism of right middle cerebral artery: Secondary | ICD-10-CM | POA: Diagnosis present

## 2018-05-03 DIAGNOSIS — G459 Transient cerebral ischemic attack, unspecified: Secondary | ICD-10-CM | POA: Diagnosis not present

## 2018-05-03 DIAGNOSIS — G8194 Hemiplegia, unspecified affecting left nondominant side: Secondary | ICD-10-CM | POA: Diagnosis present

## 2018-05-03 DIAGNOSIS — Z87891 Personal history of nicotine dependence: Secondary | ICD-10-CM | POA: Diagnosis not present

## 2018-05-03 DIAGNOSIS — N390 Urinary tract infection, site not specified: Secondary | ICD-10-CM | POA: Diagnosis not present

## 2018-05-03 DIAGNOSIS — Z79899 Other long term (current) drug therapy: Secondary | ICD-10-CM | POA: Diagnosis not present

## 2018-05-03 DIAGNOSIS — K7290 Hepatic failure, unspecified without coma: Secondary | ICD-10-CM | POA: Diagnosis not present

## 2018-05-03 DIAGNOSIS — M6281 Muscle weakness (generalized): Secondary | ICD-10-CM | POA: Diagnosis not present

## 2018-05-03 DIAGNOSIS — R2681 Unsteadiness on feet: Secondary | ICD-10-CM | POA: Diagnosis not present

## 2018-05-03 DIAGNOSIS — Z23 Encounter for immunization: Secondary | ICD-10-CM | POA: Diagnosis not present

## 2018-05-03 DIAGNOSIS — R262 Difficulty in walking, not elsewhere classified: Secondary | ICD-10-CM | POA: Diagnosis not present

## 2018-05-03 DIAGNOSIS — S0990XA Unspecified injury of head, initial encounter: Secondary | ICD-10-CM | POA: Diagnosis not present

## 2018-05-03 DIAGNOSIS — Z741 Need for assistance with personal care: Secondary | ICD-10-CM | POA: Diagnosis not present

## 2018-05-03 DIAGNOSIS — K219 Gastro-esophageal reflux disease without esophagitis: Secondary | ICD-10-CM | POA: Diagnosis present

## 2018-05-03 DIAGNOSIS — T451X5S Adverse effect of antineoplastic and immunosuppressive drugs, sequela: Secondary | ICD-10-CM | POA: Diagnosis not present

## 2018-05-03 DIAGNOSIS — S199XXA Unspecified injury of neck, initial encounter: Secondary | ICD-10-CM | POA: Diagnosis not present

## 2018-05-03 DIAGNOSIS — R278 Other lack of coordination: Secondary | ICD-10-CM | POA: Diagnosis not present

## 2018-05-03 DIAGNOSIS — E7220 Disorder of urea cycle metabolism, unspecified: Secondary | ICD-10-CM | POA: Diagnosis present

## 2018-05-03 DIAGNOSIS — K7460 Unspecified cirrhosis of liver: Secondary | ICD-10-CM | POA: Diagnosis not present

## 2018-05-03 DIAGNOSIS — I25119 Atherosclerotic heart disease of native coronary artery with unspecified angina pectoris: Secondary | ICD-10-CM | POA: Diagnosis not present

## 2018-05-05 DIAGNOSIS — W19XXXA Unspecified fall, initial encounter: Secondary | ICD-10-CM

## 2018-05-05 DIAGNOSIS — R531 Weakness: Secondary | ICD-10-CM

## 2018-05-06 DIAGNOSIS — R29706 NIHSS score 6: Secondary | ICD-10-CM | POA: Diagnosis not present

## 2018-05-06 DIAGNOSIS — R2689 Other abnormalities of gait and mobility: Secondary | ICD-10-CM | POA: Diagnosis not present

## 2018-05-06 DIAGNOSIS — G8194 Hemiplegia, unspecified affecting left nondominant side: Secondary | ICD-10-CM | POA: Diagnosis not present

## 2018-05-06 DIAGNOSIS — I480 Paroxysmal atrial fibrillation: Secondary | ICD-10-CM | POA: Diagnosis not present

## 2018-05-06 DIAGNOSIS — G9341 Metabolic encephalopathy: Secondary | ICD-10-CM | POA: Diagnosis not present

## 2018-05-06 DIAGNOSIS — R410 Disorientation, unspecified: Secondary | ICD-10-CM | POA: Diagnosis not present

## 2018-05-06 DIAGNOSIS — K7460 Unspecified cirrhosis of liver: Secondary | ICD-10-CM | POA: Diagnosis not present

## 2018-05-06 DIAGNOSIS — Z8673 Personal history of transient ischemic attack (TIA), and cerebral infarction without residual deficits: Secondary | ICD-10-CM | POA: Diagnosis not present

## 2018-05-06 DIAGNOSIS — R197 Diarrhea, unspecified: Secondary | ICD-10-CM | POA: Diagnosis not present

## 2018-05-06 DIAGNOSIS — K219 Gastro-esophageal reflux disease without esophagitis: Secondary | ICD-10-CM | POA: Diagnosis not present

## 2018-05-06 DIAGNOSIS — I8500 Esophageal varices without bleeding: Secondary | ICD-10-CM | POA: Diagnosis not present

## 2018-05-06 DIAGNOSIS — T451X5S Adverse effect of antineoplastic and immunosuppressive drugs, sequela: Secondary | ICD-10-CM | POA: Diagnosis not present

## 2018-05-06 DIAGNOSIS — Z79899 Other long term (current) drug therapy: Secondary | ICD-10-CM | POA: Diagnosis not present

## 2018-05-06 DIAGNOSIS — I6340 Cerebral infarction due to embolism of unspecified cerebral artery: Secondary | ICD-10-CM | POA: Diagnosis not present

## 2018-05-06 DIAGNOSIS — M6281 Muscle weakness (generalized): Secondary | ICD-10-CM | POA: Diagnosis not present

## 2018-05-06 DIAGNOSIS — G459 Transient cerebral ischemic attack, unspecified: Secondary | ICD-10-CM | POA: Diagnosis not present

## 2018-05-06 DIAGNOSIS — N390 Urinary tract infection, site not specified: Secondary | ICD-10-CM | POA: Diagnosis not present

## 2018-05-06 DIAGNOSIS — Z23 Encounter for immunization: Secondary | ICD-10-CM | POA: Diagnosis not present

## 2018-05-06 DIAGNOSIS — R5381 Other malaise: Secondary | ICD-10-CM | POA: Diagnosis not present

## 2018-05-06 DIAGNOSIS — W19XXXA Unspecified fall, initial encounter: Secondary | ICD-10-CM | POA: Diagnosis not present

## 2018-05-06 DIAGNOSIS — Z7401 Bed confinement status: Secondary | ICD-10-CM | POA: Diagnosis not present

## 2018-05-06 DIAGNOSIS — Z9181 History of falling: Secondary | ICD-10-CM | POA: Diagnosis not present

## 2018-05-06 DIAGNOSIS — I25119 Atherosclerotic heart disease of native coronary artery with unspecified angina pectoris: Secondary | ICD-10-CM | POA: Diagnosis not present

## 2018-05-06 DIAGNOSIS — R404 Transient alteration of awareness: Secondary | ICD-10-CM | POA: Diagnosis not present

## 2018-05-06 DIAGNOSIS — K7290 Hepatic failure, unspecified without coma: Secondary | ICD-10-CM | POA: Diagnosis not present

## 2018-05-06 DIAGNOSIS — I482 Chronic atrial fibrillation: Secondary | ICD-10-CM | POA: Diagnosis not present

## 2018-05-06 DIAGNOSIS — Z7982 Long term (current) use of aspirin: Secondary | ICD-10-CM | POA: Diagnosis not present

## 2018-05-06 DIAGNOSIS — K7469 Other cirrhosis of liver: Secondary | ICD-10-CM | POA: Diagnosis not present

## 2018-05-06 DIAGNOSIS — I10 Essential (primary) hypertension: Secondary | ICD-10-CM | POA: Diagnosis not present

## 2018-05-06 DIAGNOSIS — R2681 Unsteadiness on feet: Secondary | ICD-10-CM | POA: Diagnosis not present

## 2018-05-06 DIAGNOSIS — D638 Anemia in other chronic diseases classified elsewhere: Secondary | ICD-10-CM | POA: Diagnosis not present

## 2018-05-06 DIAGNOSIS — I639 Cerebral infarction, unspecified: Secondary | ICD-10-CM | POA: Diagnosis not present

## 2018-05-06 DIAGNOSIS — Z87891 Personal history of nicotine dependence: Secondary | ICD-10-CM | POA: Diagnosis not present

## 2018-05-06 DIAGNOSIS — Z741 Need for assistance with personal care: Secondary | ICD-10-CM | POA: Diagnosis not present

## 2018-05-06 DIAGNOSIS — I2510 Atherosclerotic heart disease of native coronary artery without angina pectoris: Secondary | ICD-10-CM | POA: Diagnosis not present

## 2018-05-06 DIAGNOSIS — R278 Other lack of coordination: Secondary | ICD-10-CM | POA: Diagnosis not present

## 2018-05-06 DIAGNOSIS — M2550 Pain in unspecified joint: Secondary | ICD-10-CM | POA: Diagnosis not present

## 2018-05-06 DIAGNOSIS — E7220 Disorder of urea cycle metabolism, unspecified: Secondary | ICD-10-CM | POA: Diagnosis not present

## 2018-05-06 DIAGNOSIS — S0191XA Laceration without foreign body of unspecified part of head, initial encounter: Secondary | ICD-10-CM | POA: Diagnosis not present

## 2018-05-09 DIAGNOSIS — R5381 Other malaise: Secondary | ICD-10-CM | POA: Diagnosis not present

## 2018-05-09 DIAGNOSIS — R197 Diarrhea, unspecified: Secondary | ICD-10-CM | POA: Diagnosis not present

## 2018-05-09 DIAGNOSIS — Z8673 Personal history of transient ischemic attack (TIA), and cerebral infarction without residual deficits: Secondary | ICD-10-CM | POA: Diagnosis not present

## 2018-05-09 DIAGNOSIS — K7460 Unspecified cirrhosis of liver: Secondary | ICD-10-CM | POA: Diagnosis not present

## 2018-05-26 ENCOUNTER — Other Ambulatory Visit: Payer: Self-pay | Admitting: *Deleted

## 2018-05-26 NOTE — Patient Outreach (Signed)
[facility]) Care Management  05/26/2018  Greg [name] 03-Aug-1934 [id number]   Met with discharge planner Archie Patten at [facility]  She stated patient has 4 sons who are very supportive along with a spouse who is supportive, Vaughan Basta.  Carole states patient's PCP is Dr. Lovette Cliche. She stated no discharge date has been set yet.    Met with patient at the bedside.  Patient sitting up in wheel chair noted to have limited use of left hand and arm.  Patient stated he was doing well in therapy especially with legs but was unable to progress much with left arm.  Patient stated his swallowing was not affected by the stroke and he can eat normally.  Patient stated he also has non alcoholic cirrhosis of the liver for which he takes lactulose.  Explained [facility] Education officer, community. Patient stated his spouse is his transportation and she also arranges his medications when he is home.  Patient requested I speak with his spouse about the services.  I left a folder of information with contact info with patient to give to spouse.   Plan to call spouse when she has had time to look over information.  Plan to make [facility] UM Team member aware of patient contact.   Rutherford Limerick RN, BSN [place] Acute Care Coordinator [phone]) Business Mobile [phone]) Toll free office

## 2018-05-30 ENCOUNTER — Other Ambulatory Visit: Payer: Self-pay | Admitting: *Deleted

## 2018-05-30 NOTE — Patient Outreach (Signed)
[place] [facility]) Care Management  05/30/2018  AAYUSH GELPI [birth year] [id number]   Placed a telephone call to patient's spouse Adrienne Delay per patient's request.  Made Ms. Combes aware of [place] Management packet left in patient's room for her to review.  [place] Management services to Ms. Adrian.  Spouse stated she had been continuing to work although she is 84 years old, but plans to quit to assist Mr. Hippler at home.  Spouse stated a ramp has been built at their home to accommodate Mr. Albarran wheelchair.  Spouse states her husband has made good progress with his left leg but still has limited use of his left arm.  Spouse stated patient was supposed to have a med change today discontinuing his lactulose and starting him on something new, but she was unsure of the name. Spouse states the facility representative plans to make a home visit on this Tuesday to evaluate the ramp and safety of the home.  Spouse states she will look over the [facility] information and discuss it with her husband before making a decision on accepting services.  Made spouse aware my card was in the folder for her.   Plan to see patient next week at facility visit.   Rutherford Limerick RN, BSN [place] Acute Care Coordinator [phone]) Business Mobile [phone]) Toll free office

## 2018-06-04 ENCOUNTER — Other Ambulatory Visit: Payer: Self-pay | Admitting: *Deleted

## 2018-06-04 ENCOUNTER — Other Ambulatory Visit: Payer: Self-pay | Admitting: Pharmacist

## 2018-06-04 NOTE — Patient Outreach (Signed)
[facility]) Care Management  06/04/2018  IHAN PAT [birth year] [id number]   Met with patient and spouse Vaughan Basta along with [facility] UM team member Manuela Schwartz at [facility] in [place].  Patient engaging in PT.  Spouse states she and patient have 4 sons and a supportive church family. She does not foresee any issues with transportation. Spouse states home ramp has been built and there are plans to have home assessment from the facility this week. Spouse stating she currently is on leave from work but is considering leaving her employment to care for patient.  Manuela Schwartz spoke with spouse about patient's new medication Xifaxan.  Spouse states she picked up samples of the medication from patient's GI MD, Dr. Nehemiah Settle.  [facility] team member talked with spouse and patient about potential high cost co-pays related to new medication.  Spouse stated she would like to speak with [facility] pharmacist about possible special programs for medication.   Patient states he started new medication yesterday, 06/03/18.  Patient confirms his PCP is Dr. Lovette Cliche.  PT states plans for patient to have [place] at discharge. Observed patient ambulating with side cane, patient continues to have decreased use of left arm.  Spouse and patient agree to Tri[employer] Education officer, community.  Spoke to nurse [place] caring for patient who states patient was given 12 days of samples and the prescription is for 6 months.   Referral sent for [facility] Pharmacist to engage spouse Vaughan Basta before patient discharges from [facility] to discuss assistance with Xifaxin.  Referral sent for [place] to engage patient at [facility] discharge.   Plan to monitor for discharge date and make assigned [place] aware of d/c date.    Rutherford Limerick RN, BSN [place] Acute Care Coordinator [phone]) Business Mobile [phone]) Toll free office

## 2018-06-05 ENCOUNTER — Other Ambulatory Visit: Payer: Self-pay | Admitting: Pharmacy Technician

## 2018-06-05 ENCOUNTER — Other Ambulatory Visit: Payer: Self-pay | Admitting: Pharmacist

## 2018-06-05 NOTE — Patient Outreach (Signed)
[place] [facility]) Care Management  06/05/2018  Greg [name] Nov 11, 1933 [id number]   Patient's wife was called in response to a message that was received by Dr. Carmie End office. HIPAA identifiers were obtained.  Dr. Carmie End office was called on conference with [name]. The nurse we spoke with looked back in the chart and said [name] did come by and pick up samples of Xifaxan on Monday and that Dr. Melina Copa had spoken with the physician at [facility]. (There was a misunderstanding at the provider's office).  The patient is in fact on Xifaxan as documented on the medication list from [facility] that was faxed on 06/04/18.  [name], PharmD, [place] Clinical Pharmacist [phone]

## 2018-06-05 NOTE — Patient Outreach (Signed)
[place] [facility]) Care Management  06/05/2018  Greg [name] 03-01-1934 [id number]   Incoming call received from Dr. Carmie End nurse concerning patient assistance application.  Dr Carmie End nurse stated that this medication was stopped in Feb 2016. Informed nurse that it appeared medication was started back upon discharge from hospital. She stated that if that was the case then it wasn't by Dr. Melina Copa.  Informed [facility] RPh Denyse Amass via phone call for  followup.  Macy Lingenfelter P. Augustina Braddock, [place] Management [phone]

## 2018-06-05 NOTE — Patient Outreach (Signed)
[facility] [name]) Care Management  06/05/2018  Greg [name] 01-12-1934 [id number]   Received [place] patient assistance referral from [place] for Xifaxan.  Prepared patient portion to be mailed. Faxed provider portion to [employer].   Will follow up with patient in 7-10 business days to confirm application has been received.  Negan Grudzien P. Hussain Maimone, [place] Management [phone]

## 2018-06-05 NOTE — Patient Outreach (Addendum)
   [facility]) Care Management  [place]   06/05/2018  Greg [name] 1933/09/20 [id number]  Reason for referral: Medication Assistance  Referral source: Social Work/Hospital liaison Referral medication(s): Xifaxan Current insurance: [employer]  HPI:  Hyperlipidemia, nonalcoholic cirrhosis, CAD, Afib, bilateral carotid artery stenosis  Spoke with patient's wife per referral. HIPAA identifiers were obtained. Patient is still in the SNF and with unknown release date. Seeking patient assistance in anticipation of when the patient is discharged home.  Objective: Allergies  Allergen Reactions  . Niaspan [Niacin Er]     Medications Reviewed Today    Reviewed by [name], [facility] (Pharmacist) on 06/05/18 at 1034  Med List Status: <None>  Medication Order Taking? Sig Documenting Provider Last Dose Status Informant  aspirin EC 81 MG tablet [id number] Yes Take 81 mg by mouth daily. [provider] Taking Active   atorvastatin (LIPITOR) 40 MG tablet [id number] Yes Take 40 mg by mouth daily. [provider] Taking Active         Discontinued 06/05/18 1034 (Discontinued by provider)   metoprolol tartrate (LOPRESSOR) 50 MG tablet [id number] Yes TAKE ONE-HALF TABLET BY MOUTH TWICE DAILY [provider] Taking Active   nitroGLYCERIN (NITROSTAT) 0.4 MG SL tablet [id number] Yes Place 1 tablet under the tongue prn every 5 minutes up to 3 doses then dial 911 [provider] Taking Active   Omega-3 Fatty Acids (FISH OIL PO) [id number] Yes Take by mouth. [provider] Taking Active   pantoprazole (PROTONIX) 40 MG tablet [id number] Yes Take 40 mg by mouth daily. [provider] Taking Active   rifaximin (XIFAXAN) 550 MG TABS tablet [id number] Yes Take 550 mg by mouth 2 (two) times daily. Nehemiah Settle, MD Taking Active           Assessment:  Drugs sorted by system:  Cardiovascular: Atorvastatin, Metoprolol, ASA,  Nitroglycerin, Omega-3 Fatty Acids (not on med list from Nursing home but patient's wife said he was taking)  Gastrointestinal: Pantoprazole, Xifaxan  Pain: Acetaminophen  Medication Review Findings:  . Patient is still in the SNF. Patient's wife was unsure of his medication regimen. The information from [place] and requested the medication list from the SNF [facility])  Medication Assistance Findings:  Extra Help:   '[]'$  Already receiving Full Extra Help  '[]'$  Already receiving Partial Extra Help  '[]'$  Eligible based on reported income and assets  '[x]'$  Not Eligible based on reported income and assets  Patient Assistance Programs: 1) Xifaxan available through: [place] Patient Assistance Program o Income requirement met: '[]'$  Yes '[]'$  No '[x]'$  Unknown o Out-of-pocket prescription expenditure met:    '[]'$  Yes '[]'$  No  '[x]'$  Unknown  '[x]'$  Not applicable      Additional medication assistance options reviewed with patient as warranted:  No other options identified  Plan: I will route patient assistance letter to [place] technician who will coordinate patient assistance program application process for medications listed above.  [facility] pharmacy technician will assist with obtaining all required documents from both patient and provider(s) and submit application(s) once completed.    Follow up on patient's application in 2 weeks. Route note to Dr. Melina Copa to alert about applications being faxed.   [name], PharmD, [place] Clinical Pharmacist [phone]

## 2018-06-07 DIAGNOSIS — R410 Disorientation, unspecified: Secondary | ICD-10-CM | POA: Diagnosis not present

## 2018-06-10 DIAGNOSIS — K7290 Hepatic failure, unspecified without coma: Secondary | ICD-10-CM | POA: Diagnosis not present

## 2018-06-10 DIAGNOSIS — I8500 Esophageal varices without bleeding: Secondary | ICD-10-CM | POA: Diagnosis not present

## 2018-06-10 DIAGNOSIS — K7460 Unspecified cirrhosis of liver: Secondary | ICD-10-CM | POA: Diagnosis not present

## 2018-06-12 DIAGNOSIS — I2510 Atherosclerotic heart disease of native coronary artery without angina pectoris: Secondary | ICD-10-CM | POA: Diagnosis not present

## 2018-06-12 DIAGNOSIS — N390 Urinary tract infection, site not specified: Secondary | ICD-10-CM | POA: Diagnosis not present

## 2018-06-12 DIAGNOSIS — D638 Anemia in other chronic diseases classified elsewhere: Secondary | ICD-10-CM | POA: Diagnosis not present

## 2018-06-12 DIAGNOSIS — I69354 Hemiplegia and hemiparesis following cerebral infarction affecting left non-dominant side: Secondary | ICD-10-CM | POA: Diagnosis not present

## 2018-06-12 DIAGNOSIS — I480 Paroxysmal atrial fibrillation: Secondary | ICD-10-CM | POA: Diagnosis not present

## 2018-06-12 DIAGNOSIS — I10 Essential (primary) hypertension: Secondary | ICD-10-CM | POA: Diagnosis not present

## 2018-06-13 ENCOUNTER — Ambulatory Visit: Payer: Medicare Other | Admitting: Pharmacy Technician

## 2018-06-16 ENCOUNTER — Other Ambulatory Visit: Payer: Self-pay | Admitting: *Deleted

## 2018-06-16 DIAGNOSIS — I69354 Hemiplegia and hemiparesis following cerebral infarction affecting left non-dominant side: Secondary | ICD-10-CM | POA: Diagnosis not present

## 2018-06-16 DIAGNOSIS — Z1331 Encounter for screening for depression: Secondary | ICD-10-CM | POA: Diagnosis not present

## 2018-06-16 DIAGNOSIS — I2510 Atherosclerotic heart disease of native coronary artery without angina pectoris: Secondary | ICD-10-CM | POA: Diagnosis not present

## 2018-06-16 DIAGNOSIS — D638 Anemia in other chronic diseases classified elsewhere: Secondary | ICD-10-CM | POA: Diagnosis not present

## 2018-06-16 DIAGNOSIS — I10 Essential (primary) hypertension: Secondary | ICD-10-CM | POA: Diagnosis not present

## 2018-06-16 DIAGNOSIS — N400 Enlarged prostate without lower urinary tract symptoms: Secondary | ICD-10-CM | POA: Diagnosis not present

## 2018-06-16 DIAGNOSIS — Z6826 Body mass index (BMI) 26.0-26.9, adult: Secondary | ICD-10-CM | POA: Diagnosis not present

## 2018-06-16 DIAGNOSIS — I480 Paroxysmal atrial fibrillation: Secondary | ICD-10-CM | POA: Diagnosis not present

## 2018-06-16 DIAGNOSIS — D510 Vitamin B12 deficiency anemia due to intrinsic factor deficiency: Secondary | ICD-10-CM | POA: Diagnosis not present

## 2018-06-16 DIAGNOSIS — N390 Urinary tract infection, site not specified: Secondary | ICD-10-CM | POA: Diagnosis not present

## 2018-06-16 NOTE — Patient Outreach (Signed)
[facility]) Care Management  06/16/2018  JACQUIS PAXTON 1933/09/20 [id number]   Transition of care   Received referral from [place] acute care coordinator  Patient with [facility] admission at [facility] 9/21-9/24/19  Dx: Lytle Michaels, CVA   Received message that [facility] was discharged [facility] on 06/12/18.   PMH : includes but not limited to.  Hyperlipidemia, nonalcoholic cirrhosis, CAD, Afib, bilateral carotid artery stenosis.  Successful outreach call to patient wife, HIPAA verified, explained reason for the call, and [facility] care management services.  Patient wife report that patient is doing okay and he had 2 therapy visit on today. Wife reports that she is cooking right now, she is agreeable to return call on the next day.    Joylene Draft, RN, [place] Management Coordinator  [phone]- Mobile [phone]- Toll Free Main Office

## 2018-06-17 ENCOUNTER — Other Ambulatory Visit: Payer: Self-pay | Admitting: *Deleted

## 2018-06-17 NOTE — Patient Outreach (Signed)
[place] [facility]) Care Management  [place]  06/17/2018   Greg [name] 12/20/1933 [id number]    Transition of care   Received referral from [place] acute care coordinator  Patient with [facility] admission [facility] 9/21-9/24/19  Dx: Lytle Michaels, CVA   Received message that patient was discharged from [facility] on 06/12/18.   PMH : includes but not limited to.  Hyperlipidemia, nonalcoholic cirrhosis, CAD, Afib, bilateral carotid artery stenosis.  Subjective:   Successful outreach call to patient wife, HIPAA verified, patient has given verbal consent that we speak with his wife.   Patient wife reports that patient is motivated to get stronger.  Wife discussed patient is doing pretty good so far. [facility] of [facility] RN, PT, first visit on yesterday.    She further discussed :  CVA Discussed patient had recent stroke and left side weakness. Patient wife is able to assist patient out of bed to wheel chair and to the bathroom. Patient does have a ramp, and niece available to assist with transfer into the car.  Wife discussed patient has been ordered a walker and bedside commode, she is able to assist patient with sponge bath for now.  Discussed CVA symptoms with wife.   Non alcoholic cirrhosis Wife discussed patient is alert oriented, clear with thoughts at home. She discussed prior to recently starting Xifaxin , patient was taking lactulose for worsening symptoms and patient got tired of frequently going to the bathroom. Currently getting samples of Xifaxin from Dr. Juanetta Snow, MD . Wife unsure how long patient will be on the medications.   UTI  Patient currently being treated for UTI on Cipro, started prior to discharge from SNF , wife states PCP office collected another specimen on Monday. Discussed worsening UTI symptoms with wife to notify MD of , fever, foul smelling urine, change in  color, cloudy, increased confusion.   Patient wife managing medications,Patient was recently discharged from hospital and all medications have been reviewed.  Encounter Medications:  Outpatient Encounter Medications as of 06/17/2018  Medication Sig  . acetaminophen (TYLENOL) 325 MG tablet Take 650 mg by mouth every 6 (six) hours as needed.  Marland Kitchen aspirin EC 81 MG tablet Take 81 mg by mouth daily.  Marland Kitchen atorvastatin (LIPITOR) 40 MG tablet Take 40 mg by mouth daily.  . metoprolol tartrate (LOPRESSOR) 50 MG tablet TAKE ONE-HALF TABLET BY MOUTH TWICE DAILY  . nitroGLYCERIN (NITROSTAT) 0.4 MG SL tablet Place 1 tablet under the tongue prn every 5 minutes up to 3 doses then dial 911  . Omega-3 Fatty Acids (FISH OIL PO) Take by mouth.  . pantoprazole (PROTONIX) 40 MG tablet Take 40 mg by mouth daily.  . rifaximin (XIFAXAN) 550 MG TABS tablet Take 550 mg by mouth 2 (two) times daily.   No facility-administered encounter medications on file as of 06/17/2018.      Assessment:  Patient wife agreeable to transition of care follow up, explained transition of care weekly outreaches.  Wife denies any new concerns at this time.   Patient has attended post discharge visit with PCP.   Plan:  Will plan return call in the next week as part of transition of care follow up. Advised to notify MD of worsening symptoms related to cirrhosis, UTI , CVA.  Will send PCP barrier note.    [facility] CM Care Plan Problem One     Most Recent Value  Care Plan  Problem One  High risk for readmission related to recent discharge from SNF after experiencing a CVA   Role Documenting the Problem One  Care Management [place] for Problem One  Active  [facility] Long Term Goal   Patient will not experience a hospital readmission in the next 31 days as evidenced by report and review of medical record.   [facility] Long Term Goal Start Date  06/16/18  Interventions for Problem One Long Term Goal  Advised on transition of care program,  provided CM contact number,   [facility] CM Short Term Goal #1   Patient/wife will be able to report at least 2 symptoms of CVA and action plan over the next 20 days   [facility] CM Short Term Goal #1 Start Date  06/17/18  Interventions for Short Term Goal #1  Advised regarding stroke symptoms , of weakness, of extremity , unable to speak clearly , uneven smile,. Advised action plan of seeking urgent medical attention .   [facility] CM Short Term Goal #2   Patient will report at least 3 fall prevention measures over the next 30 days   [facility] CM Short Term Goal #2 Start Date  06/17/18  Interventions for Short Term Goal #2  Advised wearing supportive shoes, encouraged placing,  frequently used items nearby, encouraged participation in home therapy.         Joylene Draft, RN, [place] Management Coordinator  [phone]- Mobile [phone]- Toll Free Main Office

## 2018-06-18 DIAGNOSIS — N390 Urinary tract infection, site not specified: Secondary | ICD-10-CM | POA: Diagnosis not present

## 2018-06-18 DIAGNOSIS — I2510 Atherosclerotic heart disease of native coronary artery without angina pectoris: Secondary | ICD-10-CM | POA: Diagnosis not present

## 2018-06-18 DIAGNOSIS — D638 Anemia in other chronic diseases classified elsewhere: Secondary | ICD-10-CM | POA: Diagnosis not present

## 2018-06-18 DIAGNOSIS — I480 Paroxysmal atrial fibrillation: Secondary | ICD-10-CM | POA: Diagnosis not present

## 2018-06-18 DIAGNOSIS — I10 Essential (primary) hypertension: Secondary | ICD-10-CM | POA: Diagnosis not present

## 2018-06-18 DIAGNOSIS — I69354 Hemiplegia and hemiparesis following cerebral infarction affecting left non-dominant side: Secondary | ICD-10-CM | POA: Diagnosis not present

## 2018-06-19 ENCOUNTER — Other Ambulatory Visit: Payer: Self-pay | Admitting: Pharmacy Technician

## 2018-06-19 ENCOUNTER — Ambulatory Visit: Payer: Self-pay | Admitting: Pharmacist

## 2018-06-19 ENCOUNTER — Other Ambulatory Visit: Payer: Self-pay | Admitting: Pharmacist

## 2018-06-19 DIAGNOSIS — I480 Paroxysmal atrial fibrillation: Secondary | ICD-10-CM | POA: Diagnosis not present

## 2018-06-19 DIAGNOSIS — N390 Urinary tract infection, site not specified: Secondary | ICD-10-CM | POA: Diagnosis not present

## 2018-06-19 DIAGNOSIS — I2510 Atherosclerotic heart disease of native coronary artery without angina pectoris: Secondary | ICD-10-CM | POA: Diagnosis not present

## 2018-06-19 DIAGNOSIS — I10 Essential (primary) hypertension: Secondary | ICD-10-CM | POA: Diagnosis not present

## 2018-06-19 DIAGNOSIS — I69354 Hemiplegia and hemiparesis following cerebral infarction affecting left non-dominant side: Secondary | ICD-10-CM | POA: Diagnosis not present

## 2018-06-19 DIAGNOSIS — D638 Anemia in other chronic diseases classified elsewhere: Secondary | ICD-10-CM | POA: Diagnosis not present

## 2018-06-19 NOTE — Patient Outreach (Signed)
[place] [facility]) Care Management  06/19/2018  Greg [name] [birth year] [id number]   Spoke with the patient's wife. HIPAA identifiers were obtained. Patient and wife have decided they do not want to go through patient assistance process.  Plan: Pharmacy case will be closed.  [name], PharmD, [place] Clinical Pharmacist [phone]

## 2018-06-19 NOTE — Patient Outreach (Signed)
[place] [facility]) Care Management  06/19/2018  Greg [name] June 11, 1934 [id number]  Successful outreach call placed to patient regarding his medication assistance application for Xifaxan.  Spoke to patient, HIPAA identifiers verified. Inquired if he rather me talk with his wife to whom we spoke with before and he declined.  Inquired to see if patient had received his patient assistance application for Xifaxan and he said he was "not going to fool with that" because it wanted "too much personal info". He also stated that he did not think the dr was "going to keep me on that long term"   I confirmed with patient that he had our name(s) and number(s) here at [name] and he said he did.  Will route note to [place] for followup/case closure.

## 2018-06-23 DIAGNOSIS — I69354 Hemiplegia and hemiparesis following cerebral infarction affecting left non-dominant side: Secondary | ICD-10-CM | POA: Diagnosis not present

## 2018-06-24 ENCOUNTER — Other Ambulatory Visit: Payer: Self-pay | Admitting: *Deleted

## 2018-06-24 DIAGNOSIS — N390 Urinary tract infection, site not specified: Secondary | ICD-10-CM | POA: Diagnosis not present

## 2018-06-24 DIAGNOSIS — I2510 Atherosclerotic heart disease of native coronary artery without angina pectoris: Secondary | ICD-10-CM | POA: Diagnosis not present

## 2018-06-24 DIAGNOSIS — I480 Paroxysmal atrial fibrillation: Secondary | ICD-10-CM | POA: Diagnosis not present

## 2018-06-24 DIAGNOSIS — I10 Essential (primary) hypertension: Secondary | ICD-10-CM | POA: Diagnosis not present

## 2018-06-24 DIAGNOSIS — I69354 Hemiplegia and hemiparesis following cerebral infarction affecting left non-dominant side: Secondary | ICD-10-CM | POA: Diagnosis not present

## 2018-06-24 DIAGNOSIS — D638 Anemia in other chronic diseases classified elsewhere: Secondary | ICD-10-CM | POA: Diagnosis not present

## 2018-06-24 NOTE — Patient Outreach (Signed)
[name]) Care Management  06/24/2018  Greg [name] Jan 13, 1934 [id number]   Transition of care call  Received referral from [place] acute care coordinator  Patient [facility]19Dx: Fall, CVA  Received message that patient was discharged [facility] on 06/12/18.   PMH : includes but not limited to. Hyperlipidemia, nonalcoholic cirrhosis, CAD, Afib, bilateral carotid artery stenosis.   Successful outreach call to patient , able to speak with his wife Vaughan Basta.  She discussed that patient is getting along okay at home.  Patient is alert and oriented she denies any concern regarding being confused.  Patient is tolerating diet , reports having bm's usually after each meal, irritation at rectal area she is using cornstarch at area, wife discussed since patient had gall bladder removed years ago he has a bowel movement shortly after a meal. She denies patient having diarrhea.    Wife discussed patient being active with home health , PT /OT states she was notified that hemi walker recommended by therapy, would cost $75 and patient did not want to pay that much , so she bought at 4 prong walker today and he has been using that okay.   Wife denies any other concerns at this time.    Plan Will schedule home visit in the next week.    [facility] CM Care Plan Problem One     Most Recent Value  Care Plan Problem One  High risk for readmission related to recent discharge from SNF after experiencing a CVA   Role Documenting the Problem One  Care Management [place] for Problem One  Active  [facility] Long Term Goal   Patient will not experience a hospital readmission in the next 31 days as evidenced by report and review of medical record.   [facility] Long Term Goal Start Date  06/16/18  [facility] CM Short Term Goal #1   Patient/wife will be able to report at least 2 symptoms of CVA and action plan over the next 20 days    THN CM Short Term Goal #1 Start Date  06/17/18  [facility] CM Short Term Goal #2   Patient will report at least 3 fall prevention measures over the next 30 days   THN CM Short Term Goal #2 Start Date  06/17/18  Interventions for Short Term Goal #2  discussed using assistive device       Joylene Draft, RN, [place] Management Coordinator  [phone]- Mobile [phone]- [place]

## 2018-06-25 DIAGNOSIS — I10 Essential (primary) hypertension: Secondary | ICD-10-CM | POA: Diagnosis not present

## 2018-06-25 DIAGNOSIS — I2510 Atherosclerotic heart disease of native coronary artery without angina pectoris: Secondary | ICD-10-CM | POA: Diagnosis not present

## 2018-06-25 DIAGNOSIS — I69354 Hemiplegia and hemiparesis following cerebral infarction affecting left non-dominant side: Secondary | ICD-10-CM | POA: Diagnosis not present

## 2018-06-25 DIAGNOSIS — D638 Anemia in other chronic diseases classified elsewhere: Secondary | ICD-10-CM | POA: Diagnosis not present

## 2018-06-25 DIAGNOSIS — I480 Paroxysmal atrial fibrillation: Secondary | ICD-10-CM | POA: Diagnosis not present

## 2018-06-25 DIAGNOSIS — N390 Urinary tract infection, site not specified: Secondary | ICD-10-CM | POA: Diagnosis not present

## 2018-06-26 DIAGNOSIS — I480 Paroxysmal atrial fibrillation: Secondary | ICD-10-CM | POA: Diagnosis not present

## 2018-06-26 DIAGNOSIS — I10 Essential (primary) hypertension: Secondary | ICD-10-CM | POA: Diagnosis not present

## 2018-06-26 DIAGNOSIS — N390 Urinary tract infection, site not specified: Secondary | ICD-10-CM | POA: Diagnosis not present

## 2018-06-26 DIAGNOSIS — I69354 Hemiplegia and hemiparesis following cerebral infarction affecting left non-dominant side: Secondary | ICD-10-CM | POA: Diagnosis not present

## 2018-06-26 DIAGNOSIS — I2510 Atherosclerotic heart disease of native coronary artery without angina pectoris: Secondary | ICD-10-CM | POA: Diagnosis not present

## 2018-06-26 DIAGNOSIS — D638 Anemia in other chronic diseases classified elsewhere: Secondary | ICD-10-CM | POA: Diagnosis not present

## 2018-06-27 DIAGNOSIS — I480 Paroxysmal atrial fibrillation: Secondary | ICD-10-CM | POA: Diagnosis not present

## 2018-06-27 DIAGNOSIS — I10 Essential (primary) hypertension: Secondary | ICD-10-CM | POA: Diagnosis not present

## 2018-06-27 DIAGNOSIS — D638 Anemia in other chronic diseases classified elsewhere: Secondary | ICD-10-CM | POA: Diagnosis not present

## 2018-06-27 DIAGNOSIS — I2510 Atherosclerotic heart disease of native coronary artery without angina pectoris: Secondary | ICD-10-CM | POA: Diagnosis not present

## 2018-06-27 DIAGNOSIS — I69354 Hemiplegia and hemiparesis following cerebral infarction affecting left non-dominant side: Secondary | ICD-10-CM | POA: Diagnosis not present

## 2018-06-27 DIAGNOSIS — N390 Urinary tract infection, site not specified: Secondary | ICD-10-CM | POA: Diagnosis not present

## 2018-06-30 DIAGNOSIS — I10 Essential (primary) hypertension: Secondary | ICD-10-CM | POA: Diagnosis not present

## 2018-06-30 DIAGNOSIS — N390 Urinary tract infection, site not specified: Secondary | ICD-10-CM | POA: Diagnosis not present

## 2018-06-30 DIAGNOSIS — D638 Anemia in other chronic diseases classified elsewhere: Secondary | ICD-10-CM | POA: Diagnosis not present

## 2018-06-30 DIAGNOSIS — I480 Paroxysmal atrial fibrillation: Secondary | ICD-10-CM | POA: Diagnosis not present

## 2018-06-30 DIAGNOSIS — I69354 Hemiplegia and hemiparesis following cerebral infarction affecting left non-dominant side: Secondary | ICD-10-CM | POA: Diagnosis not present

## 2018-06-30 DIAGNOSIS — I2510 Atherosclerotic heart disease of native coronary artery without angina pectoris: Secondary | ICD-10-CM | POA: Diagnosis not present

## 2018-07-01 ENCOUNTER — Other Ambulatory Visit: Payer: Self-pay | Admitting: *Deleted

## 2018-07-01 ENCOUNTER — Encounter: Payer: Self-pay | Admitting: *Deleted

## 2018-07-01 NOTE — Patient Outreach (Addendum)
[facility]) Care Management   07/01/2018  Greg [name] 12-Jul-1934 [id number]  Greg [name] is an 84 y.o. male  Subjective:  Patient reports feeling stronger since being at home .  Patient discussed recent Urinary tract infection,completing antibiotics  and follow up at primary care office.  Objective:  BP 136/78 (BP Location: Left Arm, Patient Position: Sitting, Cuff Size: Normal)   Pulse 68   Resp 18   Ht 1.727 m (5\' 8" )   Wt 175 lb (79.4 kg)   SpO2 98%   BMI 26.61 kg/m   Patient sitting in recliner chair during visit . Review of Systems  Constitutional: Negative.   HENT: Negative.   Eyes: Negative.   Respiratory: Negative.   Cardiovascular: Negative.   Gastrointestinal: Negative.   Genitourinary: Negative.   Musculoskeletal: Negative.   Skin: Negative.   Neurological:       Left side weakness  Endo/Heme/Allergies: Negative.   Psychiatric/Behavioral: Negative.     Physical Exam  Constitutional: He is oriented to person, place, and time. He appears well-developed and well-nourished.  Cardiovascular: Normal rate and normal heart sounds.  Respiratory: Effort normal and breath sounds normal.  GI: Soft. Bowel sounds are normal.  Neurological: He is alert and oriented to person, place, and time.  Skin: Skin is warm and dry.  Psychiatric: He has a normal mood and affect. His behavior is normal. Judgment and thought content normal.    Encounter Medications:   Outpatient Encounter Medications as of 07/01/2018  Medication Sig Note  . acetaminophen (TYLENOL) 325 MG tablet Take 650 mg by mouth every 6 (six) hours as needed.   . ferrous sulfate 325 (65 FE) MG tablet Take 325 mg by mouth daily with breakfast.   . metoprolol tartrate (LOPRESSOR) 50 MG tablet TAKE ONE-HALF TABLET BY MOUTH TWICE DAILY   . nitroGLYCERIN (NITROSTAT) 0.4 MG SL tablet Place 1 tablet under the tongue prn every 5 minutes up to 3 doses then dial 911 06/17/2018: Has on hand if  needed  . Omega-3 Fatty Acids (FISH OIL PO) Take by mouth.   . pantoprazole (PROTONIX) 40 MG tablet Take 40 mg by mouth daily.   . rifaximin (XIFAXAN) 550 MG TABS tablet Take 550 mg by mouth 2 (two) times daily.   . tamsulosin (FLOMAX) 0.4 MG CAPS capsule Take 0.4 mg by mouth daily.   Marland Kitchen aspirin EC 81 MG tablet Take 81 mg by mouth daily.   Marland Kitchen atorvastatin (LIPITOR) 40 MG tablet Take 40 mg by mouth daily. 06/17/2018: Takes at bedtime   No facility-administered encounter medications on file as of 07/01/2018.     Functional Status:   In your present state of health, do you have any difficulty performing the following activities: 06/17/2018  Hearing? N  Vision? N  Difficulty concentrating or making decisions? Y  Walking or climbing stairs? Y  Comment left sideweakness  Dressing or bathing? Y  Comment bath assist from wife  Doing errands, shopping? Y  Comment wife assist  Some recent data might be hidden    Fall/Depression Screening:    Fall Risk  06/17/2018  Falls in the past year? 1  Number falls in past yr: 1  Injury with Fall? 1  Risk for fall due to : History of fall(s);Impaired balance/gait  Follow up Falls prevention discussed   No flowsheet data found.  Assessment:  Initial home visit , patient wife present .  Patient remains active with [facility] home [facility].   CVA Participating  with home health therapy , making good progress. Will continue education stroke education .   Fall risk  Using a quad cane, physical therapy recommends hemi walker, wife has purchase quad cane, patient did not want $75 for hemiwalker.  Discussed tolerating mobility, in home short distances, has wheelchair for use in home also. Wife to borrow transport chair from a family member for patient next office visit. Fall prevention education reviewed.   Nonalcoholic cirrhosis  Continue to take xifaxin ,samples from [place].  Complains of having bowel movement after meals, reports he has done  this for years  since having gall bladder surgery.  Recent UTI Reviewed for UTI symptoms, reinforced notifying MD of ,elevated temperature, urine discolored, odor, pain or burning when urinating. . Wife reports patient continues to get up a few times during the night to use the urinal .  Medications Reports taking Lipitor while at rehab, but not taking it at home, also reports not taking aspirin at this time .    Plan:  Will send visit note to PCP office Provided EMMI handout on Preventing falls in the elderly Provided EMMI handout on Preventing second stroke know the signs Provided [facility] atrial fib book   Reinforced taking medications as prescribed.  Will plan next transition of care call in a week.    [facility] CM Care Plan Problem One     Most Recent Value  Care Plan Problem One  High risk for readmission related to recent discharge from SNF after experiencing a CVA   Role Documenting the Problem One  Care Management [place] for Problem One  Active  [facility] Long Term Goal   Patient will not experience a hospital readmission in the next 31 days as evidenced by report and review of medical record.   [facility]N Long Term Goal Start Date  06/16/18  Interventions for Problem One Long Term Goal  Home visit completed. discussed importance of taking medications as prescribed for prevention of complications ,   [facility] CM Short Term Goal #1   Patient/wife will be able to report at least 2 symptoms of CVA and action plan over the next 20 days   [facility]N CM Short Term Goal #1 Start Date  06/17/18  Interventions for Short Term Goal #1  Reviewed [facility] on stroke symptoms and action plan , seeking emergency help.   [facility]N CM Short Term Goal #2   Patient will report at least 3 fall prevention measures over the next 30 days   [facility]N CM Short Term Goal #2 Start Date  06/17/18  Interventions for Short Term Goal #2  Reinforced fall prevention measures, review of emmi handout , keeping frequent used items nearby, always  using walker, standing a few seconds prior   to starting to walk       Joylene Draft, RN, [place] Management Coordinator  [phone]- Mobile [phone]- [place]

## 2018-07-02 DIAGNOSIS — I2510 Atherosclerotic heart disease of native coronary artery without angina pectoris: Secondary | ICD-10-CM | POA: Diagnosis not present

## 2018-07-02 DIAGNOSIS — I69354 Hemiplegia and hemiparesis following cerebral infarction affecting left non-dominant side: Secondary | ICD-10-CM | POA: Diagnosis not present

## 2018-07-02 DIAGNOSIS — D638 Anemia in other chronic diseases classified elsewhere: Secondary | ICD-10-CM | POA: Diagnosis not present

## 2018-07-02 DIAGNOSIS — N390 Urinary tract infection, site not specified: Secondary | ICD-10-CM | POA: Diagnosis not present

## 2018-07-02 DIAGNOSIS — I10 Essential (primary) hypertension: Secondary | ICD-10-CM | POA: Diagnosis not present

## 2018-07-02 DIAGNOSIS — I480 Paroxysmal atrial fibrillation: Secondary | ICD-10-CM | POA: Diagnosis not present

## 2018-07-07 DIAGNOSIS — I2510 Atherosclerotic heart disease of native coronary artery without angina pectoris: Secondary | ICD-10-CM | POA: Diagnosis not present

## 2018-07-07 DIAGNOSIS — I480 Paroxysmal atrial fibrillation: Secondary | ICD-10-CM | POA: Diagnosis not present

## 2018-07-07 DIAGNOSIS — I69354 Hemiplegia and hemiparesis following cerebral infarction affecting left non-dominant side: Secondary | ICD-10-CM | POA: Diagnosis not present

## 2018-07-07 DIAGNOSIS — D638 Anemia in other chronic diseases classified elsewhere: Secondary | ICD-10-CM | POA: Diagnosis not present

## 2018-07-07 DIAGNOSIS — N390 Urinary tract infection, site not specified: Secondary | ICD-10-CM | POA: Diagnosis not present

## 2018-07-07 DIAGNOSIS — I10 Essential (primary) hypertension: Secondary | ICD-10-CM | POA: Diagnosis not present

## 2018-07-08 ENCOUNTER — Other Ambulatory Visit: Payer: Self-pay | Admitting: *Deleted

## 2018-07-08 DIAGNOSIS — I69354 Hemiplegia and hemiparesis following cerebral infarction affecting left non-dominant side: Secondary | ICD-10-CM | POA: Diagnosis not present

## 2018-07-08 DIAGNOSIS — N390 Urinary tract infection, site not specified: Secondary | ICD-10-CM | POA: Diagnosis not present

## 2018-07-08 DIAGNOSIS — D638 Anemia in other chronic diseases classified elsewhere: Secondary | ICD-10-CM | POA: Diagnosis not present

## 2018-07-08 DIAGNOSIS — I10 Essential (primary) hypertension: Secondary | ICD-10-CM | POA: Diagnosis not present

## 2018-07-08 DIAGNOSIS — I2510 Atherosclerotic heart disease of native coronary artery without angina pectoris: Secondary | ICD-10-CM | POA: Diagnosis not present

## 2018-07-08 DIAGNOSIS — I480 Paroxysmal atrial fibrillation: Secondary | ICD-10-CM | POA: Diagnosis not present

## 2018-07-08 NOTE — Patient Outreach (Signed)
[facility]) Care Management  07/08/2018  Greg [name] 1934/06/03 [id number]   Transition of care call  Received referral from [place] acute care coordinator  Patient with [facility] admission at [facility]/21-9/24/19Dx: Fall, CVA  Received message that patient was discharged from [facility] rehab on 06/12/18.   PMHX :  Hyperlipidemia, nonalcoholic cirrhosis, CAD, Afib, bilateral carotid artery stenosis.  Successful outreach call to patient , able to speak with his wife, HIPPA verified x 2 identifiers.  She discussed patient continues to progress well with home therapy, had PT and OT visit on yesterday and plans for nurse visit this afternoon for evaluation for continued service.  Wife discussed patient has follow up visit with PCP on next week and will discuss patient having outpatient therapy once completed with home health . Nurse will continue education and support of stroke symptoms and action plan.   History of Non alcoholic cirrhosis Patient alert oriented , no new symptoms , continues taking xifaxin as prescribed. Wife able to teachback symptoms to notify MD of , mental confusion, weakness, any noted bleeding.  Recent UTI Wife reports no new symptoms , urine color improved no complaints of burning with urination .   Wife denies any new concerns at this time.   Plan  Will schedule final transition of care call in the next week, and address continued complex care management needs.     [facility] CM Care Plan Problem One     Most Recent Value  Care Plan Problem One  High risk for readmission related to recent discharge from SNF after experiencing a CVA   Role Documenting the Problem One  Care Management [place] for Problem One  Active  [facility] Long Term Goal   Patient will not experience a hospital readmission in the next 31 days as evidenced by report and review of medical record.   [facility] Long Term Goal Start Date  06/16/18   Interventions for Problem One Long Term Goal  Reviewed patient current clinical state, advised regarding keeping upcoming PCP visit , encouraged to take medication for review.   [facility] CM Short Term Goal #1   Patient/wife will be able to report at least 2 symptoms of CVA and action plan over the next 20 days   [facility] CM Short Term Goal #1 Start Date  06/17/18  Interventions for Short Term Goal #1  Discuss with teachback symptoms of CVA and action plan   [facility] CM Short Term Goal #2   Patient will report at least 3 fall prevention measures over the next 30 days   [facility]N CM Short Term Goal #2 Start Date  06/17/18  Interventions for Short Term Goal #2  Reviewed patient progress with mobility , use of cane walker, encouraged continuing exercise activity prescribed by therapy .     [facility] CM Care Plan Problem Two     Most Recent Value  Care Plan Problem Two  Knowledge related to management of nonalcohlic cirrhosis condition   Role Documenting the Problem Two  Care Management Coordinator  Care Plan for Problem Two  Active      Joylene Draft, RN, [place] Management Coordinator  [phone]- Mobile [phone]- [place]

## 2018-07-09 DIAGNOSIS — N390 Urinary tract infection, site not specified: Secondary | ICD-10-CM | POA: Diagnosis not present

## 2018-07-09 DIAGNOSIS — I480 Paroxysmal atrial fibrillation: Secondary | ICD-10-CM | POA: Diagnosis not present

## 2018-07-09 DIAGNOSIS — I69354 Hemiplegia and hemiparesis following cerebral infarction affecting left non-dominant side: Secondary | ICD-10-CM | POA: Diagnosis not present

## 2018-07-09 DIAGNOSIS — D638 Anemia in other chronic diseases classified elsewhere: Secondary | ICD-10-CM | POA: Diagnosis not present

## 2018-07-09 DIAGNOSIS — I2510 Atherosclerotic heart disease of native coronary artery without angina pectoris: Secondary | ICD-10-CM | POA: Diagnosis not present

## 2018-07-09 DIAGNOSIS — I10 Essential (primary) hypertension: Secondary | ICD-10-CM | POA: Diagnosis not present

## 2018-07-14 DIAGNOSIS — I259 Chronic ischemic heart disease, unspecified: Secondary | ICD-10-CM | POA: Diagnosis not present

## 2018-07-14 DIAGNOSIS — N400 Enlarged prostate without lower urinary tract symptoms: Secondary | ICD-10-CM | POA: Diagnosis not present

## 2018-07-14 DIAGNOSIS — Z6826 Body mass index (BMI) 26.0-26.9, adult: Secondary | ICD-10-CM | POA: Diagnosis not present

## 2018-07-14 DIAGNOSIS — Z8673 Personal history of transient ischemic attack (TIA), and cerebral infarction without residual deficits: Secondary | ICD-10-CM | POA: Diagnosis not present

## 2018-07-18 ENCOUNTER — Other Ambulatory Visit: Payer: Self-pay | Admitting: *Deleted

## 2018-07-18 NOTE — Patient Outreach (Addendum)
[facility]) Care [name]  07/18/2018  [name] Dec 02, 1933 [id number]   Transition of care call   Successful outreach to patient wife , She reports that the patient is progressing well, tolerating activity in home. She denies any new stroke symptoms  She reports no new changes from recent PCP visit.  Wife discussed plans for patient to begin outpatient therapy after home health services completed.  .  She denies any new concerns at this time.  Patient has completed transition of care program. Will continue to follow in complex care [name] .   Plan  Will plan follow up call in the next month.  Encouraged wife to call sooner for new concerns.    [facility] CM Care Plan Problem One     Most Recent Value  Care Plan Problem One  High risk for readmission related to recent discharge from SNF after experiencing a CVA   Role Documenting the Problem One  Care [name] for Problem One  Active  [facility] Long Term Goal   Patient will not experience a hospital readmission in the next 60 days as evidenced by report and review of medical record.  Barrie Folk updated ]  [facility]N Long Term Goal Start Date  06/16/18  Interventions for Problem One Long Term Goal  Discussed current clinical state, reinforced notifying MD of new or worsening symptoms .   [facility] CM Short Term Goal #1   Patient/wife will be able to report at least 2 symptoms of CVA and action plan over the next 20 days   [facility]N CM Short Term Goal #1 Start Date  06/17/18  [facility] CM Short Term Goal #1 Met Date  07/18/18  [facility]N CM Short Term Goal #2   Patient will report at least 3 fall prevention measures over the next 30 days   [facility]N CM Short Term Goal #2 Start Date  06/17/18  [facility] CM Short Term Goal #2 Met Date  07/18/18  THN CM Short Term Goal #3  Over the next 30 days patient will be able to report participating in outpatient physical therapy .   THN CM Short Term Goal #3 Start Date  07/18/18  Interventions for  Short Tern Goal #3  Encouraged patient/wife regarding benefit on continued therapy to help with strenght balance and independence.     THN CM Care Plan Problem Two     Most Recent Value  Care Plan Problem Two  Knowledge related to [name] of nonalcohlic cirrhosis condition   Role Documenting the Problem Two  Care [name] Coordinator  Care Plan for Problem Two  Active  [facility] CM Short Term Goal #1   Wife will be able to identify at least 2 worsening symptoms of non alcholic cirrhosis conditon .   [facility] CM Short Term Goal #1 Start Date  07/08/18      Joylene Draft, RN, [name] Coordinator  [phone]- Mobile [phone]- Toll Free Main Office

## 2018-07-21 DIAGNOSIS — M6281 Muscle weakness (generalized): Secondary | ICD-10-CM | POA: Diagnosis not present

## 2018-07-21 DIAGNOSIS — R2689 Other abnormalities of gait and mobility: Secondary | ICD-10-CM | POA: Diagnosis not present

## 2018-07-21 DIAGNOSIS — N390 Urinary tract infection, site not specified: Secondary | ICD-10-CM | POA: Diagnosis not present

## 2018-07-24 DIAGNOSIS — M6281 Muscle weakness (generalized): Secondary | ICD-10-CM | POA: Diagnosis not present

## 2018-07-24 DIAGNOSIS — R2689 Other abnormalities of gait and mobility: Secondary | ICD-10-CM | POA: Diagnosis not present

## 2018-07-29 DIAGNOSIS — R161 Splenomegaly, not elsewhere classified: Secondary | ICD-10-CM | POA: Diagnosis not present

## 2018-07-29 DIAGNOSIS — Z9049 Acquired absence of other specified parts of digestive tract: Secondary | ICD-10-CM | POA: Diagnosis not present

## 2018-07-29 DIAGNOSIS — K7460 Unspecified cirrhosis of liver: Secondary | ICD-10-CM | POA: Diagnosis not present

## 2018-07-29 DIAGNOSIS — I700 Atherosclerosis of aorta: Secondary | ICD-10-CM | POA: Diagnosis not present

## 2018-07-30 DIAGNOSIS — M6281 Muscle weakness (generalized): Secondary | ICD-10-CM | POA: Diagnosis not present

## 2018-07-30 DIAGNOSIS — R2689 Other abnormalities of gait and mobility: Secondary | ICD-10-CM | POA: Diagnosis not present

## 2018-08-01 DIAGNOSIS — M6281 Muscle weakness (generalized): Secondary | ICD-10-CM | POA: Diagnosis not present

## 2018-08-01 DIAGNOSIS — R2689 Other abnormalities of gait and mobility: Secondary | ICD-10-CM | POA: Diagnosis not present

## 2018-08-04 DIAGNOSIS — R2689 Other abnormalities of gait and mobility: Secondary | ICD-10-CM | POA: Diagnosis not present

## 2018-08-04 DIAGNOSIS — M6281 Muscle weakness (generalized): Secondary | ICD-10-CM | POA: Diagnosis not present

## 2018-08-12 DIAGNOSIS — K7290 Hepatic failure, unspecified without coma: Secondary | ICD-10-CM | POA: Diagnosis not present

## 2018-08-12 DIAGNOSIS — K7460 Unspecified cirrhosis of liver: Secondary | ICD-10-CM | POA: Diagnosis not present

## 2018-08-12 DIAGNOSIS — Z1212 Encounter for screening for malignant neoplasm of rectum: Secondary | ICD-10-CM | POA: Diagnosis not present

## 2018-08-12 DIAGNOSIS — D509 Iron deficiency anemia, unspecified: Secondary | ICD-10-CM | POA: Diagnosis not present

## 2018-08-12 DIAGNOSIS — K219 Gastro-esophageal reflux disease without esophagitis: Secondary | ICD-10-CM | POA: Diagnosis not present

## 2018-08-12 DIAGNOSIS — I8500 Esophageal varices without bleeding: Secondary | ICD-10-CM | POA: Diagnosis not present

## 2018-08-15 DIAGNOSIS — R2689 Other abnormalities of gait and mobility: Secondary | ICD-10-CM | POA: Diagnosis not present

## 2018-08-15 DIAGNOSIS — M6281 Muscle weakness (generalized): Secondary | ICD-10-CM | POA: Diagnosis not present

## 2018-08-18 ENCOUNTER — Other Ambulatory Visit: Payer: Self-pay | Admitting: *Deleted

## 2018-08-18 NOTE — Patient Outreach (Signed)
[facility]) Care Management  08/18/2018  Greg [name] 1933/10/19 [id number]   Telephone follow up call  Successful outreach call spoke with patient wife. Wife discussed patient doing good, she discussed patient continues with outpatient physical therapy and making good progress.   Wife discussed patient has visited with PCP recently and no new concerns.  She shared patient had recent Abdominal US and follow up visit with Dr.Butler and everything looked good, blood count was 12.  She discussed patient decided to no longer taken Xifaxin or lactulose, she states they discussed this at recent visit with  Dr.Butler, she report has has been started on a new medication that she names as equalactin,she reports that they are able to afford.   Wife discussed MD recommended to be notified if patient develops any new problems with weakness, forgetfulness. Fatigue , nausea weight loss wife reports patient does not have symptoms and she understanding to notify MD.    Complex care management goals have been met, wife declines further needs at this time,after discuss telephonic health coach program.  Wife agreeable with case closure, made sure wife has [facility] care management contact number if new needs arise.   Plan Will plan case closure, goals met.  Will send PCP case closure letter    Joylene Draft, RN, [place] Management Coordinator  [phone]- Mobile [phone]- Toll Free Main Office

## 2018-08-20 DIAGNOSIS — M6281 Muscle weakness (generalized): Secondary | ICD-10-CM | POA: Diagnosis not present

## 2018-08-20 DIAGNOSIS — R2689 Other abnormalities of gait and mobility: Secondary | ICD-10-CM | POA: Diagnosis not present

## 2018-08-22 DIAGNOSIS — M6281 Muscle weakness (generalized): Secondary | ICD-10-CM | POA: Diagnosis not present

## 2018-08-22 DIAGNOSIS — R2689 Other abnormalities of gait and mobility: Secondary | ICD-10-CM | POA: Diagnosis not present

## 2018-08-27 DIAGNOSIS — R2689 Other abnormalities of gait and mobility: Secondary | ICD-10-CM | POA: Diagnosis not present

## 2018-08-27 DIAGNOSIS — M6281 Muscle weakness (generalized): Secondary | ICD-10-CM | POA: Diagnosis not present

## 2018-08-29 DIAGNOSIS — R2689 Other abnormalities of gait and mobility: Secondary | ICD-10-CM | POA: Diagnosis not present

## 2018-08-29 DIAGNOSIS — M6281 Muscle weakness (generalized): Secondary | ICD-10-CM | POA: Diagnosis not present

## 2018-09-01 DIAGNOSIS — M6281 Muscle weakness (generalized): Secondary | ICD-10-CM | POA: Diagnosis not present

## 2018-09-01 DIAGNOSIS — R2689 Other abnormalities of gait and mobility: Secondary | ICD-10-CM | POA: Diagnosis not present

## 2018-09-03 DIAGNOSIS — M6281 Muscle weakness (generalized): Secondary | ICD-10-CM | POA: Diagnosis not present

## 2018-09-03 DIAGNOSIS — R2689 Other abnormalities of gait and mobility: Secondary | ICD-10-CM | POA: Diagnosis not present

## 2018-09-08 DIAGNOSIS — M6281 Muscle weakness (generalized): Secondary | ICD-10-CM | POA: Diagnosis not present

## 2018-09-08 DIAGNOSIS — R2689 Other abnormalities of gait and mobility: Secondary | ICD-10-CM | POA: Diagnosis not present

## 2018-09-11 DIAGNOSIS — Z9181 History of falling: Secondary | ICD-10-CM | POA: Diagnosis not present

## 2018-09-11 DIAGNOSIS — R2689 Other abnormalities of gait and mobility: Secondary | ICD-10-CM | POA: Diagnosis not present

## 2018-09-11 DIAGNOSIS — R278 Other lack of coordination: Secondary | ICD-10-CM | POA: Diagnosis not present

## 2018-09-11 DIAGNOSIS — M6281 Muscle weakness (generalized): Secondary | ICD-10-CM | POA: Diagnosis not present

## 2018-09-17 DIAGNOSIS — R2689 Other abnormalities of gait and mobility: Secondary | ICD-10-CM | POA: Diagnosis not present

## 2018-09-17 DIAGNOSIS — M6281 Muscle weakness (generalized): Secondary | ICD-10-CM | POA: Diagnosis not present

## 2018-09-23 DIAGNOSIS — D510 Vitamin B12 deficiency anemia due to intrinsic factor deficiency: Secondary | ICD-10-CM | POA: Diagnosis not present

## 2018-09-24 DIAGNOSIS — R2689 Other abnormalities of gait and mobility: Secondary | ICD-10-CM | POA: Diagnosis not present

## 2018-09-24 DIAGNOSIS — M6281 Muscle weakness (generalized): Secondary | ICD-10-CM | POA: Diagnosis not present

## 2018-10-01 DIAGNOSIS — R2689 Other abnormalities of gait and mobility: Secondary | ICD-10-CM | POA: Diagnosis not present

## 2018-10-01 DIAGNOSIS — M6281 Muscle weakness (generalized): Secondary | ICD-10-CM | POA: Diagnosis not present

## 2018-10-06 DIAGNOSIS — R2689 Other abnormalities of gait and mobility: Secondary | ICD-10-CM | POA: Diagnosis not present

## 2018-10-06 DIAGNOSIS — M6281 Muscle weakness (generalized): Secondary | ICD-10-CM | POA: Diagnosis not present

## 2018-10-11 DIAGNOSIS — R2689 Other abnormalities of gait and mobility: Secondary | ICD-10-CM | POA: Diagnosis not present

## 2018-10-11 DIAGNOSIS — M6281 Muscle weakness (generalized): Secondary | ICD-10-CM | POA: Diagnosis not present

## 2018-10-11 DIAGNOSIS — R278 Other lack of coordination: Secondary | ICD-10-CM | POA: Diagnosis not present

## 2018-10-11 DIAGNOSIS — Z9181 History of falling: Secondary | ICD-10-CM | POA: Diagnosis not present

## 2018-10-15 DIAGNOSIS — M6281 Muscle weakness (generalized): Secondary | ICD-10-CM | POA: Diagnosis not present

## 2018-10-15 DIAGNOSIS — R2689 Other abnormalities of gait and mobility: Secondary | ICD-10-CM | POA: Diagnosis not present

## 2018-10-16 DIAGNOSIS — Z79899 Other long term (current) drug therapy: Secondary | ICD-10-CM | POA: Diagnosis not present

## 2018-10-16 DIAGNOSIS — D519 Vitamin B12 deficiency anemia, unspecified: Secondary | ICD-10-CM | POA: Diagnosis not present

## 2018-10-16 DIAGNOSIS — Z0000 Encounter for general adult medical examination without abnormal findings: Secondary | ICD-10-CM | POA: Diagnosis not present

## 2018-10-16 DIAGNOSIS — E785 Hyperlipidemia, unspecified: Secondary | ICD-10-CM | POA: Diagnosis not present

## 2018-10-16 DIAGNOSIS — Z6824 Body mass index (BMI) 24.0-24.9, adult: Secondary | ICD-10-CM | POA: Diagnosis not present

## 2018-10-17 DIAGNOSIS — S92102A Unspecified fracture of left talus, initial encounter for closed fracture: Secondary | ICD-10-CM | POA: Diagnosis not present

## 2018-10-22 DIAGNOSIS — M6281 Muscle weakness (generalized): Secondary | ICD-10-CM | POA: Diagnosis not present

## 2018-10-22 DIAGNOSIS — R2689 Other abnormalities of gait and mobility: Secondary | ICD-10-CM | POA: Diagnosis not present

## 2018-10-29 DIAGNOSIS — M6281 Muscle weakness (generalized): Secondary | ICD-10-CM | POA: Diagnosis not present

## 2018-10-29 DIAGNOSIS — R2689 Other abnormalities of gait and mobility: Secondary | ICD-10-CM | POA: Diagnosis not present

## 2018-11-05 DIAGNOSIS — R2689 Other abnormalities of gait and mobility: Secondary | ICD-10-CM | POA: Diagnosis not present

## 2018-11-05 DIAGNOSIS — M6281 Muscle weakness (generalized): Secondary | ICD-10-CM | POA: Diagnosis not present

## 2018-11-10 DIAGNOSIS — R2689 Other abnormalities of gait and mobility: Secondary | ICD-10-CM | POA: Diagnosis not present

## 2018-11-10 DIAGNOSIS — Z9181 History of falling: Secondary | ICD-10-CM | POA: Diagnosis not present

## 2018-11-10 DIAGNOSIS — R278 Other lack of coordination: Secondary | ICD-10-CM | POA: Diagnosis not present

## 2018-11-10 DIAGNOSIS — M6281 Muscle weakness (generalized): Secondary | ICD-10-CM | POA: Diagnosis not present

## 2018-11-17 DIAGNOSIS — L570 Actinic keratosis: Secondary | ICD-10-CM | POA: Diagnosis not present

## 2018-11-17 DIAGNOSIS — D510 Vitamin B12 deficiency anemia due to intrinsic factor deficiency: Secondary | ICD-10-CM | POA: Diagnosis not present

## 2018-11-17 DIAGNOSIS — L409 Psoriasis, unspecified: Secondary | ICD-10-CM | POA: Diagnosis not present

## 2018-11-18 DIAGNOSIS — D519 Vitamin B12 deficiency anemia, unspecified: Secondary | ICD-10-CM | POA: Diagnosis not present

## 2018-12-11 DIAGNOSIS — M6281 Muscle weakness (generalized): Secondary | ICD-10-CM | POA: Diagnosis not present

## 2018-12-11 DIAGNOSIS — Z9181 History of falling: Secondary | ICD-10-CM | POA: Diagnosis not present

## 2018-12-11 DIAGNOSIS — R2689 Other abnormalities of gait and mobility: Secondary | ICD-10-CM | POA: Diagnosis not present

## 2018-12-11 DIAGNOSIS — R278 Other lack of coordination: Secondary | ICD-10-CM | POA: Diagnosis not present

## 2019-01-08 DIAGNOSIS — Z6823 Body mass index (BMI) 23.0-23.9, adult: Secondary | ICD-10-CM | POA: Diagnosis not present

## 2019-01-08 DIAGNOSIS — E538 Deficiency of other specified B group vitamins: Secondary | ICD-10-CM | POA: Diagnosis not present

## 2019-01-08 DIAGNOSIS — Z131 Encounter for screening for diabetes mellitus: Secondary | ICD-10-CM | POA: Diagnosis not present

## 2019-01-08 DIAGNOSIS — Z79899 Other long term (current) drug therapy: Secondary | ICD-10-CM | POA: Diagnosis not present

## 2019-01-08 DIAGNOSIS — L409 Psoriasis, unspecified: Secondary | ICD-10-CM | POA: Diagnosis not present

## 2019-01-10 DIAGNOSIS — R278 Other lack of coordination: Secondary | ICD-10-CM | POA: Diagnosis not present

## 2019-01-10 DIAGNOSIS — R2689 Other abnormalities of gait and mobility: Secondary | ICD-10-CM | POA: Diagnosis not present

## 2019-01-10 DIAGNOSIS — Z9181 History of falling: Secondary | ICD-10-CM | POA: Diagnosis not present

## 2019-01-10 DIAGNOSIS — M6281 Muscle weakness (generalized): Secondary | ICD-10-CM | POA: Diagnosis not present

## 2019-01-16 DIAGNOSIS — I639 Cerebral infarction, unspecified: Secondary | ICD-10-CM | POA: Diagnosis not present

## 2019-01-16 DIAGNOSIS — I482 Chronic atrial fibrillation, unspecified: Secondary | ICD-10-CM | POA: Diagnosis not present

## 2019-01-16 DIAGNOSIS — I2510 Atherosclerotic heart disease of native coronary artery without angina pectoris: Secondary | ICD-10-CM | POA: Diagnosis not present

## 2019-01-16 DIAGNOSIS — I6523 Occlusion and stenosis of bilateral carotid arteries: Secondary | ICD-10-CM | POA: Diagnosis not present

## 2019-01-16 DIAGNOSIS — K7460 Unspecified cirrhosis of liver: Secondary | ICD-10-CM | POA: Diagnosis not present

## 2019-01-22 DIAGNOSIS — N318 Other neuromuscular dysfunction of bladder: Secondary | ICD-10-CM | POA: Diagnosis not present

## 2019-02-05 DIAGNOSIS — R3915 Urgency of urination: Secondary | ICD-10-CM | POA: Diagnosis not present

## 2019-02-10 DIAGNOSIS — M6281 Muscle weakness (generalized): Secondary | ICD-10-CM | POA: Diagnosis not present

## 2019-02-10 DIAGNOSIS — R278 Other lack of coordination: Secondary | ICD-10-CM | POA: Diagnosis not present

## 2019-02-10 DIAGNOSIS — R2689 Other abnormalities of gait and mobility: Secondary | ICD-10-CM | POA: Diagnosis not present

## 2019-02-10 DIAGNOSIS — Z9181 History of falling: Secondary | ICD-10-CM | POA: Diagnosis not present

## 2019-03-09 DIAGNOSIS — R3915 Urgency of urination: Secondary | ICD-10-CM | POA: Diagnosis not present

## 2019-03-09 DIAGNOSIS — N3020 Other chronic cystitis without hematuria: Secondary | ICD-10-CM | POA: Diagnosis not present

## 2019-03-10 DIAGNOSIS — D509 Iron deficiency anemia, unspecified: Secondary | ICD-10-CM | POA: Diagnosis not present

## 2019-03-11 DIAGNOSIS — K7460 Unspecified cirrhosis of liver: Secondary | ICD-10-CM | POA: Diagnosis not present

## 2019-03-12 DIAGNOSIS — M6281 Muscle weakness (generalized): Secondary | ICD-10-CM | POA: Diagnosis not present

## 2019-03-12 DIAGNOSIS — R278 Other lack of coordination: Secondary | ICD-10-CM | POA: Diagnosis not present

## 2019-03-12 DIAGNOSIS — Z9181 History of falling: Secondary | ICD-10-CM | POA: Diagnosis not present

## 2019-03-12 DIAGNOSIS — R2689 Other abnormalities of gait and mobility: Secondary | ICD-10-CM | POA: Diagnosis not present

## 2019-03-17 DIAGNOSIS — K7460 Unspecified cirrhosis of liver: Secondary | ICD-10-CM | POA: Diagnosis not present

## 2019-03-17 DIAGNOSIS — R160 Hepatomegaly, not elsewhere classified: Secondary | ICD-10-CM | POA: Diagnosis not present

## 2019-03-23 DIAGNOSIS — N318 Other neuromuscular dysfunction of bladder: Secondary | ICD-10-CM | POA: Diagnosis not present

## 2019-03-23 DIAGNOSIS — N3020 Other chronic cystitis without hematuria: Secondary | ICD-10-CM | POA: Diagnosis not present

## 2019-03-25 DIAGNOSIS — K7200 Acute and subacute hepatic failure without coma: Secondary | ICD-10-CM | POA: Diagnosis not present

## 2019-03-25 DIAGNOSIS — I959 Hypotension, unspecified: Secondary | ICD-10-CM | POA: Diagnosis not present

## 2019-03-25 DIAGNOSIS — R531 Weakness: Secondary | ICD-10-CM | POA: Diagnosis not present

## 2019-03-25 DIAGNOSIS — Z87891 Personal history of nicotine dependence: Secondary | ICD-10-CM | POA: Diagnosis not present

## 2019-03-25 DIAGNOSIS — I69954 Hemiplegia and hemiparesis following unspecified cerebrovascular disease affecting left non-dominant side: Secondary | ICD-10-CM | POA: Diagnosis not present

## 2019-03-25 DIAGNOSIS — I480 Paroxysmal atrial fibrillation: Secondary | ICD-10-CM | POA: Diagnosis not present

## 2019-03-25 DIAGNOSIS — G92 Toxic encephalopathy: Secondary | ICD-10-CM | POA: Diagnosis not present

## 2019-03-25 DIAGNOSIS — K219 Gastro-esophageal reflux disease without esophagitis: Secondary | ICD-10-CM | POA: Diagnosis not present

## 2019-03-25 DIAGNOSIS — R404 Transient alteration of awareness: Secondary | ICD-10-CM | POA: Diagnosis not present

## 2019-03-25 DIAGNOSIS — I2510 Atherosclerotic heart disease of native coronary artery without angina pectoris: Secondary | ICD-10-CM | POA: Diagnosis not present

## 2019-03-25 DIAGNOSIS — Z79899 Other long term (current) drug therapy: Secondary | ICD-10-CM | POA: Diagnosis not present

## 2019-03-25 DIAGNOSIS — K7290 Hepatic failure, unspecified without coma: Secondary | ICD-10-CM | POA: Diagnosis not present

## 2019-03-25 DIAGNOSIS — R410 Disorientation, unspecified: Secondary | ICD-10-CM | POA: Diagnosis not present

## 2019-03-25 DIAGNOSIS — K7460 Unspecified cirrhosis of liver: Secondary | ICD-10-CM | POA: Diagnosis not present

## 2019-03-31 DIAGNOSIS — K7460 Unspecified cirrhosis of liver: Secondary | ICD-10-CM | POA: Diagnosis not present

## 2019-03-31 DIAGNOSIS — G9341 Metabolic encephalopathy: Secondary | ICD-10-CM | POA: Diagnosis not present

## 2019-03-31 DIAGNOSIS — I8501 Esophageal varices with bleeding: Secondary | ICD-10-CM | POA: Diagnosis not present

## 2019-03-31 DIAGNOSIS — I480 Paroxysmal atrial fibrillation: Secondary | ICD-10-CM | POA: Diagnosis not present

## 2019-03-31 DIAGNOSIS — I2510 Atherosclerotic heart disease of native coronary artery without angina pectoris: Secondary | ICD-10-CM | POA: Diagnosis not present

## 2019-03-31 DIAGNOSIS — I69354 Hemiplegia and hemiparesis following cerebral infarction affecting left non-dominant side: Secondary | ICD-10-CM | POA: Diagnosis not present

## 2019-04-01 DIAGNOSIS — D510 Vitamin B12 deficiency anemia due to intrinsic factor deficiency: Secondary | ICD-10-CM | POA: Diagnosis not present

## 2019-04-01 DIAGNOSIS — I4891 Unspecified atrial fibrillation: Secondary | ICD-10-CM | POA: Diagnosis not present

## 2019-04-01 DIAGNOSIS — I8500 Esophageal varices without bleeding: Secondary | ICD-10-CM | POA: Diagnosis not present

## 2019-04-01 DIAGNOSIS — Z6822 Body mass index (BMI) 22.0-22.9, adult: Secondary | ICD-10-CM | POA: Diagnosis not present

## 2019-04-01 DIAGNOSIS — K7460 Unspecified cirrhosis of liver: Secondary | ICD-10-CM | POA: Diagnosis not present

## 2019-04-03 DIAGNOSIS — G9341 Metabolic encephalopathy: Secondary | ICD-10-CM | POA: Diagnosis not present

## 2019-04-03 DIAGNOSIS — I480 Paroxysmal atrial fibrillation: Secondary | ICD-10-CM | POA: Diagnosis not present

## 2019-04-03 DIAGNOSIS — I2510 Atherosclerotic heart disease of native coronary artery without angina pectoris: Secondary | ICD-10-CM | POA: Diagnosis not present

## 2019-04-03 DIAGNOSIS — I69354 Hemiplegia and hemiparesis following cerebral infarction affecting left non-dominant side: Secondary | ICD-10-CM | POA: Diagnosis not present

## 2019-04-03 DIAGNOSIS — K7460 Unspecified cirrhosis of liver: Secondary | ICD-10-CM | POA: Diagnosis not present

## 2019-04-03 DIAGNOSIS — I8501 Esophageal varices with bleeding: Secondary | ICD-10-CM | POA: Diagnosis not present

## 2019-04-06 DIAGNOSIS — I69354 Hemiplegia and hemiparesis following cerebral infarction affecting left non-dominant side: Secondary | ICD-10-CM | POA: Diagnosis not present

## 2019-04-06 DIAGNOSIS — G9341 Metabolic encephalopathy: Secondary | ICD-10-CM | POA: Diagnosis not present

## 2019-04-06 DIAGNOSIS — I8501 Esophageal varices with bleeding: Secondary | ICD-10-CM | POA: Diagnosis not present

## 2019-04-06 DIAGNOSIS — K7460 Unspecified cirrhosis of liver: Secondary | ICD-10-CM | POA: Diagnosis not present

## 2019-04-06 DIAGNOSIS — I480 Paroxysmal atrial fibrillation: Secondary | ICD-10-CM | POA: Diagnosis not present

## 2019-04-06 DIAGNOSIS — I2510 Atherosclerotic heart disease of native coronary artery without angina pectoris: Secondary | ICD-10-CM | POA: Diagnosis not present

## 2019-04-07 DIAGNOSIS — K7460 Unspecified cirrhosis of liver: Secondary | ICD-10-CM | POA: Diagnosis not present

## 2019-04-07 DIAGNOSIS — R197 Diarrhea, unspecified: Secondary | ICD-10-CM | POA: Diagnosis not present

## 2019-04-07 DIAGNOSIS — K7290 Hepatic failure, unspecified without coma: Secondary | ICD-10-CM | POA: Diagnosis not present

## 2019-04-07 DIAGNOSIS — D509 Iron deficiency anemia, unspecified: Secondary | ICD-10-CM | POA: Diagnosis not present

## 2019-04-09 DIAGNOSIS — I2510 Atherosclerotic heart disease of native coronary artery without angina pectoris: Secondary | ICD-10-CM | POA: Diagnosis not present

## 2019-04-09 DIAGNOSIS — I480 Paroxysmal atrial fibrillation: Secondary | ICD-10-CM | POA: Diagnosis not present

## 2019-04-09 DIAGNOSIS — G9341 Metabolic encephalopathy: Secondary | ICD-10-CM | POA: Diagnosis not present

## 2019-04-09 DIAGNOSIS — I69354 Hemiplegia and hemiparesis following cerebral infarction affecting left non-dominant side: Secondary | ICD-10-CM | POA: Diagnosis not present

## 2019-04-09 DIAGNOSIS — I8501 Esophageal varices with bleeding: Secondary | ICD-10-CM | POA: Diagnosis not present

## 2019-04-09 DIAGNOSIS — K7460 Unspecified cirrhosis of liver: Secondary | ICD-10-CM | POA: Diagnosis not present

## 2019-04-12 DIAGNOSIS — M6281 Muscle weakness (generalized): Secondary | ICD-10-CM | POA: Diagnosis not present

## 2019-04-12 DIAGNOSIS — R278 Other lack of coordination: Secondary | ICD-10-CM | POA: Diagnosis not present

## 2019-04-12 DIAGNOSIS — R2689 Other abnormalities of gait and mobility: Secondary | ICD-10-CM | POA: Diagnosis not present

## 2019-04-12 DIAGNOSIS — Z9181 History of falling: Secondary | ICD-10-CM | POA: Diagnosis not present

## 2019-04-13 DIAGNOSIS — I480 Paroxysmal atrial fibrillation: Secondary | ICD-10-CM | POA: Diagnosis not present

## 2019-04-13 DIAGNOSIS — K7460 Unspecified cirrhosis of liver: Secondary | ICD-10-CM | POA: Diagnosis not present

## 2019-04-13 DIAGNOSIS — I8501 Esophageal varices with bleeding: Secondary | ICD-10-CM | POA: Diagnosis not present

## 2019-04-13 DIAGNOSIS — G9341 Metabolic encephalopathy: Secondary | ICD-10-CM | POA: Diagnosis not present

## 2019-04-13 DIAGNOSIS — I69354 Hemiplegia and hemiparesis following cerebral infarction affecting left non-dominant side: Secondary | ICD-10-CM | POA: Diagnosis not present

## 2019-04-13 DIAGNOSIS — I2510 Atherosclerotic heart disease of native coronary artery without angina pectoris: Secondary | ICD-10-CM | POA: Diagnosis not present

## 2019-04-21 DIAGNOSIS — R197 Diarrhea, unspecified: Secondary | ICD-10-CM | POA: Diagnosis not present

## 2019-04-21 DIAGNOSIS — K7290 Hepatic failure, unspecified without coma: Secondary | ICD-10-CM | POA: Diagnosis not present

## 2019-04-22 DIAGNOSIS — I480 Paroxysmal atrial fibrillation: Secondary | ICD-10-CM | POA: Diagnosis not present

## 2019-04-22 DIAGNOSIS — I8501 Esophageal varices with bleeding: Secondary | ICD-10-CM | POA: Diagnosis not present

## 2019-04-22 DIAGNOSIS — G9341 Metabolic encephalopathy: Secondary | ICD-10-CM | POA: Diagnosis not present

## 2019-04-22 DIAGNOSIS — I69354 Hemiplegia and hemiparesis following cerebral infarction affecting left non-dominant side: Secondary | ICD-10-CM | POA: Diagnosis not present

## 2019-04-22 DIAGNOSIS — K7460 Unspecified cirrhosis of liver: Secondary | ICD-10-CM | POA: Diagnosis not present

## 2019-04-22 DIAGNOSIS — I2510 Atherosclerotic heart disease of native coronary artery without angina pectoris: Secondary | ICD-10-CM | POA: Diagnosis not present

## 2019-05-04 DIAGNOSIS — D519 Vitamin B12 deficiency anemia, unspecified: Secondary | ICD-10-CM | POA: Diagnosis not present

## 2019-05-09 DIAGNOSIS — Z23 Encounter for immunization: Secondary | ICD-10-CM | POA: Diagnosis not present

## 2019-05-13 DIAGNOSIS — M6281 Muscle weakness (generalized): Secondary | ICD-10-CM | POA: Diagnosis not present

## 2019-05-13 DIAGNOSIS — R2689 Other abnormalities of gait and mobility: Secondary | ICD-10-CM | POA: Diagnosis not present

## 2019-05-13 DIAGNOSIS — R278 Other lack of coordination: Secondary | ICD-10-CM | POA: Diagnosis not present

## 2019-05-13 DIAGNOSIS — Z9181 History of falling: Secondary | ICD-10-CM | POA: Diagnosis not present

## 2019-06-12 DIAGNOSIS — E7800 Pure hypercholesterolemia, unspecified: Secondary | ICD-10-CM | POA: Diagnosis not present

## 2019-06-12 DIAGNOSIS — R278 Other lack of coordination: Secondary | ICD-10-CM | POA: Diagnosis not present

## 2019-06-12 DIAGNOSIS — D519 Vitamin B12 deficiency anemia, unspecified: Secondary | ICD-10-CM | POA: Diagnosis not present

## 2019-06-12 DIAGNOSIS — Z9181 History of falling: Secondary | ICD-10-CM | POA: Diagnosis not present

## 2019-06-12 DIAGNOSIS — M6281 Muscle weakness (generalized): Secondary | ICD-10-CM | POA: Diagnosis not present

## 2019-06-12 DIAGNOSIS — R2689 Other abnormalities of gait and mobility: Secondary | ICD-10-CM | POA: Diagnosis not present

## 2019-06-12 DIAGNOSIS — R197 Diarrhea, unspecified: Secondary | ICD-10-CM | POA: Diagnosis not present

## 2019-06-29 DIAGNOSIS — K7460 Unspecified cirrhosis of liver: Secondary | ICD-10-CM | POA: Diagnosis not present

## 2019-07-06 DIAGNOSIS — R160 Hepatomegaly, not elsewhere classified: Secondary | ICD-10-CM | POA: Diagnosis not present

## 2019-07-06 DIAGNOSIS — R161 Splenomegaly, not elsewhere classified: Secondary | ICD-10-CM | POA: Diagnosis not present

## 2019-07-06 DIAGNOSIS — K7460 Unspecified cirrhosis of liver: Secondary | ICD-10-CM | POA: Diagnosis not present

## 2019-07-17 DIAGNOSIS — I639 Cerebral infarction, unspecified: Secondary | ICD-10-CM | POA: Diagnosis not present

## 2019-07-17 DIAGNOSIS — I6523 Occlusion and stenosis of bilateral carotid arteries: Secondary | ICD-10-CM | POA: Diagnosis not present

## 2019-07-17 DIAGNOSIS — K7460 Unspecified cirrhosis of liver: Secondary | ICD-10-CM | POA: Diagnosis not present

## 2019-07-17 DIAGNOSIS — I2510 Atherosclerotic heart disease of native coronary artery without angina pectoris: Secondary | ICD-10-CM | POA: Diagnosis not present

## 2019-07-17 DIAGNOSIS — I482 Chronic atrial fibrillation, unspecified: Secondary | ICD-10-CM | POA: Diagnosis not present

## 2019-07-21 DIAGNOSIS — D509 Iron deficiency anemia, unspecified: Secondary | ICD-10-CM | POA: Diagnosis not present

## 2019-07-21 DIAGNOSIS — K3189 Other diseases of stomach and duodenum: Secondary | ICD-10-CM | POA: Diagnosis not present

## 2019-07-21 DIAGNOSIS — I8500 Esophageal varices without bleeding: Secondary | ICD-10-CM | POA: Diagnosis not present

## 2019-07-21 DIAGNOSIS — K580 Irritable bowel syndrome with diarrhea: Secondary | ICD-10-CM | POA: Diagnosis not present

## 2019-07-21 DIAGNOSIS — K7460 Unspecified cirrhosis of liver: Secondary | ICD-10-CM | POA: Diagnosis not present

## 2019-08-13 DIAGNOSIS — D519 Vitamin B12 deficiency anemia, unspecified: Secondary | ICD-10-CM | POA: Diagnosis not present

## 2019-08-13 DIAGNOSIS — E7800 Pure hypercholesterolemia, unspecified: Secondary | ICD-10-CM | POA: Diagnosis not present

## 2019-08-25 DIAGNOSIS — I340 Nonrheumatic mitral (valve) insufficiency: Secondary | ICD-10-CM | POA: Diagnosis not present

## 2019-08-25 DIAGNOSIS — I482 Chronic atrial fibrillation, unspecified: Secondary | ICD-10-CM | POA: Diagnosis not present

## 2019-08-25 DIAGNOSIS — R05 Cough: Secondary | ICD-10-CM | POA: Diagnosis not present

## 2019-08-25 DIAGNOSIS — Z87891 Personal history of nicotine dependence: Secondary | ICD-10-CM | POA: Diagnosis not present

## 2019-08-25 DIAGNOSIS — G8194 Hemiplegia, unspecified affecting left nondominant side: Secondary | ICD-10-CM | POA: Diagnosis not present

## 2019-08-25 DIAGNOSIS — K625 Hemorrhage of anus and rectum: Secondary | ICD-10-CM | POA: Diagnosis not present

## 2019-08-25 DIAGNOSIS — I252 Old myocardial infarction: Secondary | ICD-10-CM | POA: Diagnosis not present

## 2019-08-25 DIAGNOSIS — R4020 Unspecified coma: Secondary | ICD-10-CM | POA: Diagnosis not present

## 2019-08-25 DIAGNOSIS — I714 Abdominal aortic aneurysm, without rupture: Secondary | ICD-10-CM | POA: Diagnosis not present

## 2019-08-25 DIAGNOSIS — J1282 Pneumonia due to Coronavirus disease 2019: Secondary | ICD-10-CM | POA: Diagnosis not present

## 2019-08-25 DIAGNOSIS — I6523 Occlusion and stenosis of bilateral carotid arteries: Secondary | ICD-10-CM | POA: Diagnosis not present

## 2019-08-25 DIAGNOSIS — D6959 Other secondary thrombocytopenia: Secondary | ICD-10-CM | POA: Diagnosis not present

## 2019-08-25 DIAGNOSIS — I2510 Atherosclerotic heart disease of native coronary artery without angina pectoris: Secondary | ICD-10-CM | POA: Diagnosis not present

## 2019-08-25 DIAGNOSIS — L539 Erythematous condition, unspecified: Secondary | ICD-10-CM | POA: Diagnosis not present

## 2019-08-25 DIAGNOSIS — U071 COVID-19: Secondary | ICD-10-CM | POA: Diagnosis not present

## 2019-08-25 DIAGNOSIS — Z743 Need for continuous supervision: Secondary | ICD-10-CM | POA: Diagnosis not present

## 2019-08-25 DIAGNOSIS — Z66 Do not resuscitate: Secondary | ICD-10-CM | POA: Diagnosis not present

## 2019-08-25 DIAGNOSIS — K7290 Hepatic failure, unspecified without coma: Secondary | ICD-10-CM | POA: Diagnosis not present

## 2019-08-25 DIAGNOSIS — Z955 Presence of coronary angioplasty implant and graft: Secondary | ICD-10-CM | POA: Diagnosis not present

## 2019-08-25 DIAGNOSIS — I8510 Secondary esophageal varices without bleeding: Secondary | ICD-10-CM | POA: Diagnosis not present

## 2019-08-25 DIAGNOSIS — Z9049 Acquired absence of other specified parts of digestive tract: Secondary | ICD-10-CM | POA: Diagnosis not present

## 2019-08-25 DIAGNOSIS — Z8679 Personal history of other diseases of the circulatory system: Secondary | ICD-10-CM | POA: Diagnosis not present

## 2019-08-25 DIAGNOSIS — R0902 Hypoxemia: Secondary | ICD-10-CM | POA: Diagnosis not present

## 2019-08-25 DIAGNOSIS — R7881 Bacteremia: Secondary | ICD-10-CM | POA: Diagnosis not present

## 2019-08-25 DIAGNOSIS — I10 Essential (primary) hypertension: Secondary | ICD-10-CM | POA: Diagnosis not present

## 2019-08-25 DIAGNOSIS — I272 Pulmonary hypertension, unspecified: Secondary | ICD-10-CM | POA: Diagnosis not present

## 2019-08-25 DIAGNOSIS — K745 Biliary cirrhosis, unspecified: Secondary | ICD-10-CM | POA: Diagnosis not present

## 2019-08-25 DIAGNOSIS — I959 Hypotension, unspecified: Secondary | ICD-10-CM | POA: Diagnosis not present

## 2019-08-25 DIAGNOSIS — K743 Primary biliary cirrhosis: Secondary | ICD-10-CM | POA: Diagnosis not present

## 2019-08-25 DIAGNOSIS — I8501 Esophageal varices with bleeding: Secondary | ICD-10-CM | POA: Diagnosis not present

## 2019-08-25 DIAGNOSIS — I739 Peripheral vascular disease, unspecified: Secondary | ICD-10-CM | POA: Diagnosis not present

## 2019-08-25 DIAGNOSIS — G9340 Encephalopathy, unspecified: Secondary | ICD-10-CM | POA: Diagnosis not present

## 2019-08-25 DIAGNOSIS — I480 Paroxysmal atrial fibrillation: Secondary | ICD-10-CM | POA: Diagnosis not present

## 2019-08-26 DIAGNOSIS — R0902 Hypoxemia: Secondary | ICD-10-CM | POA: Diagnosis not present

## 2019-08-26 DIAGNOSIS — U071 COVID-19: Secondary | ICD-10-CM | POA: Diagnosis not present

## 2019-08-26 DIAGNOSIS — J1282 Pneumonia due to Coronavirus disease 2019: Secondary | ICD-10-CM | POA: Diagnosis not present

## 2019-08-26 DIAGNOSIS — G9340 Encephalopathy, unspecified: Secondary | ICD-10-CM | POA: Diagnosis not present

## 2019-08-26 DIAGNOSIS — K743 Primary biliary cirrhosis: Secondary | ICD-10-CM | POA: Diagnosis not present

## 2019-08-27 DIAGNOSIS — U071 COVID-19: Secondary | ICD-10-CM | POA: Diagnosis not present

## 2019-08-27 DIAGNOSIS — G9340 Encephalopathy, unspecified: Secondary | ICD-10-CM | POA: Diagnosis not present

## 2019-08-27 DIAGNOSIS — R7881 Bacteremia: Secondary | ICD-10-CM | POA: Diagnosis not present

## 2019-08-27 DIAGNOSIS — J1282 Pneumonia due to Coronavirus disease 2019: Secondary | ICD-10-CM | POA: Diagnosis not present

## 2019-08-27 DIAGNOSIS — R0902 Hypoxemia: Secondary | ICD-10-CM | POA: Diagnosis not present

## 2019-08-28 DIAGNOSIS — U071 COVID-19: Secondary | ICD-10-CM | POA: Diagnosis not present

## 2019-08-28 DIAGNOSIS — G9340 Encephalopathy, unspecified: Secondary | ICD-10-CM | POA: Diagnosis not present

## 2019-08-28 DIAGNOSIS — K625 Hemorrhage of anus and rectum: Secondary | ICD-10-CM | POA: Diagnosis not present

## 2019-08-28 DIAGNOSIS — R7881 Bacteremia: Secondary | ICD-10-CM | POA: Diagnosis not present

## 2019-08-28 DIAGNOSIS — J1282 Pneumonia due to Coronavirus disease 2019: Secondary | ICD-10-CM | POA: Diagnosis not present

## 2019-08-29 DIAGNOSIS — G9340 Encephalopathy, unspecified: Secondary | ICD-10-CM | POA: Diagnosis not present

## 2019-08-29 DIAGNOSIS — R7881 Bacteremia: Secondary | ICD-10-CM | POA: Diagnosis not present

## 2019-08-29 DIAGNOSIS — K625 Hemorrhage of anus and rectum: Secondary | ICD-10-CM | POA: Diagnosis not present

## 2019-08-29 DIAGNOSIS — U071 COVID-19: Secondary | ICD-10-CM | POA: Diagnosis not present

## 2019-08-29 DIAGNOSIS — J1282 Pneumonia due to Coronavirus disease 2019: Secondary | ICD-10-CM | POA: Diagnosis not present

## 2019-08-30 DIAGNOSIS — U071 COVID-19: Secondary | ICD-10-CM | POA: Diagnosis not present

## 2019-08-30 DIAGNOSIS — R7881 Bacteremia: Secondary | ICD-10-CM | POA: Diagnosis not present

## 2019-08-30 DIAGNOSIS — K625 Hemorrhage of anus and rectum: Secondary | ICD-10-CM | POA: Diagnosis not present

## 2019-08-30 DIAGNOSIS — J1282 Pneumonia due to Coronavirus disease 2019: Secondary | ICD-10-CM | POA: Diagnosis not present

## 2019-08-30 DIAGNOSIS — R0902 Hypoxemia: Secondary | ICD-10-CM | POA: Diagnosis not present

## 2019-08-31 DIAGNOSIS — U071 COVID-19: Secondary | ICD-10-CM | POA: Diagnosis not present

## 2019-08-31 DIAGNOSIS — K625 Hemorrhage of anus and rectum: Secondary | ICD-10-CM | POA: Diagnosis not present

## 2019-08-31 DIAGNOSIS — R0902 Hypoxemia: Secondary | ICD-10-CM | POA: Diagnosis not present

## 2019-08-31 DIAGNOSIS — R7881 Bacteremia: Secondary | ICD-10-CM | POA: Diagnosis not present

## 2019-08-31 DIAGNOSIS — J1282 Pneumonia due to Coronavirus disease 2019: Secondary | ICD-10-CM | POA: Diagnosis not present

## 2019-09-01 DIAGNOSIS — K743 Primary biliary cirrhosis: Secondary | ICD-10-CM | POA: Diagnosis not present

## 2019-09-01 DIAGNOSIS — U071 COVID-19: Secondary | ICD-10-CM | POA: Diagnosis not present

## 2019-09-01 DIAGNOSIS — J1282 Pneumonia due to Coronavirus disease 2019: Secondary | ICD-10-CM | POA: Diagnosis not present

## 2019-09-01 DIAGNOSIS — R0902 Hypoxemia: Secondary | ICD-10-CM | POA: Diagnosis not present

## 2019-09-01 DIAGNOSIS — R7881 Bacteremia: Secondary | ICD-10-CM | POA: Diagnosis not present

## 2019-09-02 DIAGNOSIS — K743 Primary biliary cirrhosis: Secondary | ICD-10-CM | POA: Diagnosis not present

## 2019-09-02 DIAGNOSIS — J1282 Pneumonia due to Coronavirus disease 2019: Secondary | ICD-10-CM | POA: Diagnosis not present

## 2019-09-02 DIAGNOSIS — R0902 Hypoxemia: Secondary | ICD-10-CM | POA: Diagnosis not present

## 2019-09-02 DIAGNOSIS — U071 COVID-19: Secondary | ICD-10-CM | POA: Diagnosis not present

## 2019-09-02 DIAGNOSIS — R7881 Bacteremia: Secondary | ICD-10-CM | POA: Diagnosis not present

## 2019-09-03 DIAGNOSIS — U071 COVID-19: Secondary | ICD-10-CM | POA: Diagnosis not present

## 2019-09-03 DIAGNOSIS — J1282 Pneumonia due to Coronavirus disease 2019: Secondary | ICD-10-CM | POA: Diagnosis not present

## 2019-09-03 DIAGNOSIS — R0902 Hypoxemia: Secondary | ICD-10-CM | POA: Diagnosis not present

## 2019-09-03 DIAGNOSIS — K743 Primary biliary cirrhosis: Secondary | ICD-10-CM | POA: Diagnosis not present

## 2019-09-03 DIAGNOSIS — R7881 Bacteremia: Secondary | ICD-10-CM | POA: Diagnosis not present

## 2019-09-08 DIAGNOSIS — G9341 Metabolic encephalopathy: Secondary | ICD-10-CM | POA: Diagnosis not present

## 2019-09-08 DIAGNOSIS — I2510 Atherosclerotic heart disease of native coronary artery without angina pectoris: Secondary | ICD-10-CM | POA: Diagnosis not present

## 2019-09-08 DIAGNOSIS — I69354 Hemiplegia and hemiparesis following cerebral infarction affecting left non-dominant side: Secondary | ICD-10-CM | POA: Diagnosis not present

## 2019-09-08 DIAGNOSIS — I8501 Esophageal varices with bleeding: Secondary | ICD-10-CM | POA: Diagnosis not present

## 2019-09-08 DIAGNOSIS — K7460 Unspecified cirrhosis of liver: Secondary | ICD-10-CM | POA: Diagnosis not present

## 2019-09-08 DIAGNOSIS — I480 Paroxysmal atrial fibrillation: Secondary | ICD-10-CM | POA: Diagnosis not present

## 2019-09-08 DIAGNOSIS — D696 Thrombocytopenia, unspecified: Secondary | ICD-10-CM | POA: Diagnosis not present

## 2019-09-08 DIAGNOSIS — I272 Pulmonary hypertension, unspecified: Secondary | ICD-10-CM | POA: Diagnosis not present

## 2019-09-08 DIAGNOSIS — I739 Peripheral vascular disease, unspecified: Secondary | ICD-10-CM | POA: Diagnosis not present

## 2019-09-08 DIAGNOSIS — B9689 Other specified bacterial agents as the cause of diseases classified elsewhere: Secondary | ICD-10-CM | POA: Diagnosis not present

## 2019-09-09 DIAGNOSIS — I8501 Esophageal varices with bleeding: Secondary | ICD-10-CM | POA: Diagnosis not present

## 2019-09-09 DIAGNOSIS — G9341 Metabolic encephalopathy: Secondary | ICD-10-CM | POA: Diagnosis not present

## 2019-09-09 DIAGNOSIS — I2510 Atherosclerotic heart disease of native coronary artery without angina pectoris: Secondary | ICD-10-CM | POA: Diagnosis not present

## 2019-09-09 DIAGNOSIS — K7460 Unspecified cirrhosis of liver: Secondary | ICD-10-CM | POA: Diagnosis not present

## 2019-09-09 DIAGNOSIS — I739 Peripheral vascular disease, unspecified: Secondary | ICD-10-CM | POA: Diagnosis not present

## 2019-09-09 DIAGNOSIS — D696 Thrombocytopenia, unspecified: Secondary | ICD-10-CM | POA: Diagnosis not present

## 2019-09-09 DIAGNOSIS — I480 Paroxysmal atrial fibrillation: Secondary | ICD-10-CM | POA: Diagnosis not present

## 2019-09-09 DIAGNOSIS — I69354 Hemiplegia and hemiparesis following cerebral infarction affecting left non-dominant side: Secondary | ICD-10-CM | POA: Diagnosis not present

## 2019-09-09 DIAGNOSIS — I272 Pulmonary hypertension, unspecified: Secondary | ICD-10-CM | POA: Diagnosis not present

## 2019-09-09 DIAGNOSIS — B9689 Other specified bacterial agents as the cause of diseases classified elsewhere: Secondary | ICD-10-CM | POA: Diagnosis not present

## 2019-09-11 DIAGNOSIS — Z6821 Body mass index (BMI) 21.0-21.9, adult: Secondary | ICD-10-CM | POA: Diagnosis not present

## 2019-09-11 DIAGNOSIS — Z9181 History of falling: Secondary | ICD-10-CM | POA: Diagnosis not present

## 2019-09-11 DIAGNOSIS — D509 Iron deficiency anemia, unspecified: Secondary | ICD-10-CM | POA: Diagnosis not present

## 2019-09-11 DIAGNOSIS — D519 Vitamin B12 deficiency anemia, unspecified: Secondary | ICD-10-CM | POA: Diagnosis not present

## 2019-09-15 DIAGNOSIS — D509 Iron deficiency anemia, unspecified: Secondary | ICD-10-CM | POA: Diagnosis not present

## 2019-09-15 DIAGNOSIS — J90 Pleural effusion, not elsewhere classified: Secondary | ICD-10-CM | POA: Diagnosis not present

## 2019-09-15 DIAGNOSIS — I272 Pulmonary hypertension, unspecified: Secondary | ICD-10-CM | POA: Diagnosis not present

## 2019-09-15 DIAGNOSIS — E7220 Disorder of urea cycle metabolism, unspecified: Secondary | ICD-10-CM | POA: Diagnosis not present

## 2019-09-15 DIAGNOSIS — K7460 Unspecified cirrhosis of liver: Secondary | ICD-10-CM | POA: Diagnosis not present

## 2019-09-15 DIAGNOSIS — I739 Peripheral vascular disease, unspecified: Secondary | ICD-10-CM | POA: Diagnosis not present

## 2019-09-15 DIAGNOSIS — K219 Gastro-esophageal reflux disease without esophagitis: Secondary | ICD-10-CM | POA: Diagnosis not present

## 2019-09-15 DIAGNOSIS — E785 Hyperlipidemia, unspecified: Secondary | ICD-10-CM | POA: Diagnosis not present

## 2019-09-15 DIAGNOSIS — D696 Thrombocytopenia, unspecified: Secondary | ICD-10-CM | POA: Diagnosis not present

## 2019-09-15 DIAGNOSIS — I69354 Hemiplegia and hemiparesis following cerebral infarction affecting left non-dominant side: Secondary | ICD-10-CM | POA: Diagnosis not present

## 2019-09-15 DIAGNOSIS — Z8616 Personal history of COVID-19: Secondary | ICD-10-CM | POA: Diagnosis not present

## 2019-09-15 DIAGNOSIS — K766 Portal hypertension: Secondary | ICD-10-CM | POA: Diagnosis not present

## 2019-09-15 DIAGNOSIS — I480 Paroxysmal atrial fibrillation: Secondary | ICD-10-CM | POA: Diagnosis not present

## 2019-09-15 DIAGNOSIS — I2510 Atherosclerotic heart disease of native coronary artery without angina pectoris: Secondary | ICD-10-CM | POA: Diagnosis not present

## 2019-09-17 DIAGNOSIS — J90 Pleural effusion, not elsewhere classified: Secondary | ICD-10-CM | POA: Diagnosis not present

## 2019-09-17 DIAGNOSIS — D509 Iron deficiency anemia, unspecified: Secondary | ICD-10-CM | POA: Diagnosis not present

## 2019-09-17 DIAGNOSIS — I272 Pulmonary hypertension, unspecified: Secondary | ICD-10-CM | POA: Diagnosis not present

## 2019-09-17 DIAGNOSIS — E7220 Disorder of urea cycle metabolism, unspecified: Secondary | ICD-10-CM | POA: Diagnosis not present

## 2019-09-17 DIAGNOSIS — D696 Thrombocytopenia, unspecified: Secondary | ICD-10-CM | POA: Diagnosis not present

## 2019-09-17 DIAGNOSIS — I2510 Atherosclerotic heart disease of native coronary artery without angina pectoris: Secondary | ICD-10-CM | POA: Diagnosis not present

## 2019-09-17 DIAGNOSIS — Z8616 Personal history of COVID-19: Secondary | ICD-10-CM | POA: Diagnosis not present

## 2019-09-17 DIAGNOSIS — I480 Paroxysmal atrial fibrillation: Secondary | ICD-10-CM | POA: Diagnosis not present

## 2019-09-17 DIAGNOSIS — E785 Hyperlipidemia, unspecified: Secondary | ICD-10-CM | POA: Diagnosis not present

## 2019-09-17 DIAGNOSIS — I69354 Hemiplegia and hemiparesis following cerebral infarction affecting left non-dominant side: Secondary | ICD-10-CM | POA: Diagnosis not present

## 2019-09-17 DIAGNOSIS — K219 Gastro-esophageal reflux disease without esophagitis: Secondary | ICD-10-CM | POA: Diagnosis not present

## 2019-09-17 DIAGNOSIS — K766 Portal hypertension: Secondary | ICD-10-CM | POA: Diagnosis not present

## 2019-09-17 DIAGNOSIS — K7460 Unspecified cirrhosis of liver: Secondary | ICD-10-CM | POA: Diagnosis not present

## 2019-09-17 DIAGNOSIS — I739 Peripheral vascular disease, unspecified: Secondary | ICD-10-CM | POA: Diagnosis not present

## 2019-09-18 DIAGNOSIS — I739 Peripheral vascular disease, unspecified: Secondary | ICD-10-CM | POA: Diagnosis not present

## 2019-09-18 DIAGNOSIS — I480 Paroxysmal atrial fibrillation: Secondary | ICD-10-CM | POA: Diagnosis not present

## 2019-09-18 DIAGNOSIS — I272 Pulmonary hypertension, unspecified: Secondary | ICD-10-CM | POA: Diagnosis not present

## 2019-09-18 DIAGNOSIS — E785 Hyperlipidemia, unspecified: Secondary | ICD-10-CM | POA: Diagnosis not present

## 2019-09-18 DIAGNOSIS — D509 Iron deficiency anemia, unspecified: Secondary | ICD-10-CM | POA: Diagnosis not present

## 2019-09-18 DIAGNOSIS — D696 Thrombocytopenia, unspecified: Secondary | ICD-10-CM | POA: Diagnosis not present

## 2019-09-18 DIAGNOSIS — I2510 Atherosclerotic heart disease of native coronary artery without angina pectoris: Secondary | ICD-10-CM | POA: Diagnosis not present

## 2019-09-18 DIAGNOSIS — Z8616 Personal history of COVID-19: Secondary | ICD-10-CM | POA: Diagnosis not present

## 2019-09-18 DIAGNOSIS — E7220 Disorder of urea cycle metabolism, unspecified: Secondary | ICD-10-CM | POA: Diagnosis not present

## 2019-09-18 DIAGNOSIS — K766 Portal hypertension: Secondary | ICD-10-CM | POA: Diagnosis not present

## 2019-09-18 DIAGNOSIS — K219 Gastro-esophageal reflux disease without esophagitis: Secondary | ICD-10-CM | POA: Diagnosis not present

## 2019-09-18 DIAGNOSIS — K7460 Unspecified cirrhosis of liver: Secondary | ICD-10-CM | POA: Diagnosis not present

## 2019-09-18 DIAGNOSIS — I69354 Hemiplegia and hemiparesis following cerebral infarction affecting left non-dominant side: Secondary | ICD-10-CM | POA: Diagnosis not present

## 2019-09-18 DIAGNOSIS — J90 Pleural effusion, not elsewhere classified: Secondary | ICD-10-CM | POA: Diagnosis not present

## 2019-09-21 DIAGNOSIS — K219 Gastro-esophageal reflux disease without esophagitis: Secondary | ICD-10-CM | POA: Diagnosis not present

## 2019-09-21 DIAGNOSIS — I2510 Atherosclerotic heart disease of native coronary artery without angina pectoris: Secondary | ICD-10-CM | POA: Diagnosis not present

## 2019-09-21 DIAGNOSIS — E7220 Disorder of urea cycle metabolism, unspecified: Secondary | ICD-10-CM | POA: Diagnosis not present

## 2019-09-21 DIAGNOSIS — D509 Iron deficiency anemia, unspecified: Secondary | ICD-10-CM | POA: Diagnosis not present

## 2019-09-21 DIAGNOSIS — Z8616 Personal history of COVID-19: Secondary | ICD-10-CM | POA: Diagnosis not present

## 2019-09-21 DIAGNOSIS — I69354 Hemiplegia and hemiparesis following cerebral infarction affecting left non-dominant side: Secondary | ICD-10-CM | POA: Diagnosis not present

## 2019-09-21 DIAGNOSIS — J90 Pleural effusion, not elsewhere classified: Secondary | ICD-10-CM | POA: Diagnosis not present

## 2019-09-21 DIAGNOSIS — D696 Thrombocytopenia, unspecified: Secondary | ICD-10-CM | POA: Diagnosis not present

## 2019-09-21 DIAGNOSIS — I739 Peripheral vascular disease, unspecified: Secondary | ICD-10-CM | POA: Diagnosis not present

## 2019-09-21 DIAGNOSIS — K766 Portal hypertension: Secondary | ICD-10-CM | POA: Diagnosis not present

## 2019-09-21 DIAGNOSIS — I272 Pulmonary hypertension, unspecified: Secondary | ICD-10-CM | POA: Diagnosis not present

## 2019-09-21 DIAGNOSIS — K7460 Unspecified cirrhosis of liver: Secondary | ICD-10-CM | POA: Diagnosis not present

## 2019-09-21 DIAGNOSIS — I480 Paroxysmal atrial fibrillation: Secondary | ICD-10-CM | POA: Diagnosis not present

## 2019-09-21 DIAGNOSIS — E785 Hyperlipidemia, unspecified: Secondary | ICD-10-CM | POA: Diagnosis not present

## 2019-09-22 DIAGNOSIS — K4090 Unilateral inguinal hernia, without obstruction or gangrene, not specified as recurrent: Secondary | ICD-10-CM | POA: Diagnosis not present

## 2019-09-28 DIAGNOSIS — J90 Pleural effusion, not elsewhere classified: Secondary | ICD-10-CM | POA: Diagnosis not present

## 2019-09-28 DIAGNOSIS — K219 Gastro-esophageal reflux disease without esophagitis: Secondary | ICD-10-CM | POA: Diagnosis not present

## 2019-09-28 DIAGNOSIS — K7460 Unspecified cirrhosis of liver: Secondary | ICD-10-CM | POA: Diagnosis not present

## 2019-09-28 DIAGNOSIS — I272 Pulmonary hypertension, unspecified: Secondary | ICD-10-CM | POA: Diagnosis not present

## 2019-09-28 DIAGNOSIS — I739 Peripheral vascular disease, unspecified: Secondary | ICD-10-CM | POA: Diagnosis not present

## 2019-09-28 DIAGNOSIS — E7220 Disorder of urea cycle metabolism, unspecified: Secondary | ICD-10-CM | POA: Diagnosis not present

## 2019-09-28 DIAGNOSIS — K766 Portal hypertension: Secondary | ICD-10-CM | POA: Diagnosis not present

## 2019-09-28 DIAGNOSIS — I480 Paroxysmal atrial fibrillation: Secondary | ICD-10-CM | POA: Diagnosis not present

## 2019-09-28 DIAGNOSIS — I2510 Atherosclerotic heart disease of native coronary artery without angina pectoris: Secondary | ICD-10-CM | POA: Diagnosis not present

## 2019-09-28 DIAGNOSIS — D509 Iron deficiency anemia, unspecified: Secondary | ICD-10-CM | POA: Diagnosis not present

## 2019-09-28 DIAGNOSIS — E785 Hyperlipidemia, unspecified: Secondary | ICD-10-CM | POA: Diagnosis not present

## 2019-09-28 DIAGNOSIS — Z8616 Personal history of COVID-19: Secondary | ICD-10-CM | POA: Diagnosis not present

## 2019-09-28 DIAGNOSIS — D696 Thrombocytopenia, unspecified: Secondary | ICD-10-CM | POA: Diagnosis not present

## 2019-09-28 DIAGNOSIS — I69354 Hemiplegia and hemiparesis following cerebral infarction affecting left non-dominant side: Secondary | ICD-10-CM | POA: Diagnosis not present

## 2019-09-29 DIAGNOSIS — E785 Hyperlipidemia, unspecified: Secondary | ICD-10-CM | POA: Diagnosis not present

## 2019-09-29 DIAGNOSIS — K766 Portal hypertension: Secondary | ICD-10-CM | POA: Diagnosis not present

## 2019-09-29 DIAGNOSIS — D696 Thrombocytopenia, unspecified: Secondary | ICD-10-CM | POA: Diagnosis not present

## 2019-09-29 DIAGNOSIS — I272 Pulmonary hypertension, unspecified: Secondary | ICD-10-CM | POA: Diagnosis not present

## 2019-09-29 DIAGNOSIS — I480 Paroxysmal atrial fibrillation: Secondary | ICD-10-CM | POA: Diagnosis not present

## 2019-09-29 DIAGNOSIS — K7460 Unspecified cirrhosis of liver: Secondary | ICD-10-CM | POA: Diagnosis not present

## 2019-09-29 DIAGNOSIS — I739 Peripheral vascular disease, unspecified: Secondary | ICD-10-CM | POA: Diagnosis not present

## 2019-09-29 DIAGNOSIS — I69354 Hemiplegia and hemiparesis following cerebral infarction affecting left non-dominant side: Secondary | ICD-10-CM | POA: Diagnosis not present

## 2019-09-29 DIAGNOSIS — E7220 Disorder of urea cycle metabolism, unspecified: Secondary | ICD-10-CM | POA: Diagnosis not present

## 2019-09-29 DIAGNOSIS — K219 Gastro-esophageal reflux disease without esophagitis: Secondary | ICD-10-CM | POA: Diagnosis not present

## 2019-09-29 DIAGNOSIS — D509 Iron deficiency anemia, unspecified: Secondary | ICD-10-CM | POA: Diagnosis not present

## 2019-09-29 DIAGNOSIS — J90 Pleural effusion, not elsewhere classified: Secondary | ICD-10-CM | POA: Diagnosis not present

## 2019-09-29 DIAGNOSIS — I2510 Atherosclerotic heart disease of native coronary artery without angina pectoris: Secondary | ICD-10-CM | POA: Diagnosis not present

## 2019-09-29 DIAGNOSIS — Z8616 Personal history of COVID-19: Secondary | ICD-10-CM | POA: Diagnosis not present

## 2019-09-30 DIAGNOSIS — K7290 Hepatic failure, unspecified without coma: Secondary | ICD-10-CM | POA: Diagnosis not present

## 2019-09-30 DIAGNOSIS — K7460 Unspecified cirrhosis of liver: Secondary | ICD-10-CM | POA: Diagnosis not present

## 2019-10-02 DIAGNOSIS — I2510 Atherosclerotic heart disease of native coronary artery without angina pectoris: Secondary | ICD-10-CM | POA: Diagnosis not present

## 2019-10-02 DIAGNOSIS — K766 Portal hypertension: Secondary | ICD-10-CM | POA: Diagnosis not present

## 2019-10-02 DIAGNOSIS — I739 Peripheral vascular disease, unspecified: Secondary | ICD-10-CM | POA: Diagnosis not present

## 2019-10-02 DIAGNOSIS — E785 Hyperlipidemia, unspecified: Secondary | ICD-10-CM | POA: Diagnosis not present

## 2019-10-02 DIAGNOSIS — J90 Pleural effusion, not elsewhere classified: Secondary | ICD-10-CM | POA: Diagnosis not present

## 2019-10-02 DIAGNOSIS — I69354 Hemiplegia and hemiparesis following cerebral infarction affecting left non-dominant side: Secondary | ICD-10-CM | POA: Diagnosis not present

## 2019-10-02 DIAGNOSIS — D696 Thrombocytopenia, unspecified: Secondary | ICD-10-CM | POA: Diagnosis not present

## 2019-10-02 DIAGNOSIS — K7460 Unspecified cirrhosis of liver: Secondary | ICD-10-CM | POA: Diagnosis not present

## 2019-10-02 DIAGNOSIS — Z8616 Personal history of COVID-19: Secondary | ICD-10-CM | POA: Diagnosis not present

## 2019-10-02 DIAGNOSIS — K219 Gastro-esophageal reflux disease without esophagitis: Secondary | ICD-10-CM | POA: Diagnosis not present

## 2019-10-02 DIAGNOSIS — I480 Paroxysmal atrial fibrillation: Secondary | ICD-10-CM | POA: Diagnosis not present

## 2019-10-02 DIAGNOSIS — I272 Pulmonary hypertension, unspecified: Secondary | ICD-10-CM | POA: Diagnosis not present

## 2019-10-02 DIAGNOSIS — E7220 Disorder of urea cycle metabolism, unspecified: Secondary | ICD-10-CM | POA: Diagnosis not present

## 2019-10-02 DIAGNOSIS — D509 Iron deficiency anemia, unspecified: Secondary | ICD-10-CM | POA: Diagnosis not present

## 2019-10-05 DIAGNOSIS — K219 Gastro-esophageal reflux disease without esophagitis: Secondary | ICD-10-CM | POA: Diagnosis not present

## 2019-10-05 DIAGNOSIS — I4891 Unspecified atrial fibrillation: Secondary | ICD-10-CM | POA: Diagnosis not present

## 2019-10-05 DIAGNOSIS — D509 Iron deficiency anemia, unspecified: Secondary | ICD-10-CM | POA: Diagnosis not present

## 2019-10-05 DIAGNOSIS — K7290 Hepatic failure, unspecified without coma: Secondary | ICD-10-CM | POA: Diagnosis not present

## 2019-10-05 DIAGNOSIS — Z87891 Personal history of nicotine dependence: Secondary | ICD-10-CM | POA: Diagnosis not present

## 2019-10-05 DIAGNOSIS — E785 Hyperlipidemia, unspecified: Secondary | ICD-10-CM | POA: Diagnosis not present

## 2019-10-05 DIAGNOSIS — I10 Essential (primary) hypertension: Secondary | ICD-10-CM | POA: Diagnosis not present

## 2019-10-05 DIAGNOSIS — R918 Other nonspecific abnormal finding of lung field: Secondary | ICD-10-CM | POA: Diagnosis not present

## 2019-10-05 DIAGNOSIS — R188 Other ascites: Secondary | ICD-10-CM | POA: Diagnosis not present

## 2019-10-05 DIAGNOSIS — R404 Transient alteration of awareness: Secondary | ICD-10-CM | POA: Diagnosis not present

## 2019-10-05 DIAGNOSIS — R52 Pain, unspecified: Secondary | ICD-10-CM | POA: Diagnosis not present

## 2019-10-05 DIAGNOSIS — Z955 Presence of coronary angioplasty implant and graft: Secondary | ICD-10-CM | POA: Diagnosis not present

## 2019-10-05 DIAGNOSIS — R4182 Altered mental status, unspecified: Secondary | ICD-10-CM | POA: Diagnosis not present

## 2019-10-05 DIAGNOSIS — Z79899 Other long term (current) drug therapy: Secondary | ICD-10-CM | POA: Diagnosis not present

## 2019-10-05 DIAGNOSIS — K4090 Unilateral inguinal hernia, without obstruction or gangrene, not specified as recurrent: Secondary | ICD-10-CM | POA: Diagnosis not present

## 2019-10-05 DIAGNOSIS — K766 Portal hypertension: Secondary | ICD-10-CM | POA: Diagnosis not present

## 2019-10-05 DIAGNOSIS — Z743 Need for continuous supervision: Secondary | ICD-10-CM | POA: Diagnosis not present

## 2019-10-05 DIAGNOSIS — I2510 Atherosclerotic heart disease of native coronary artery without angina pectoris: Secondary | ICD-10-CM | POA: Diagnosis not present

## 2019-10-05 DIAGNOSIS — I69954 Hemiplegia and hemiparesis following unspecified cerebrovascular disease affecting left non-dominant side: Secondary | ICD-10-CM | POA: Diagnosis not present

## 2019-10-05 DIAGNOSIS — I63511 Cerebral infarction due to unspecified occlusion or stenosis of right middle cerebral artery: Secondary | ICD-10-CM | POA: Diagnosis not present

## 2019-10-05 DIAGNOSIS — I959 Hypotension, unspecified: Secondary | ICD-10-CM | POA: Diagnosis not present

## 2019-10-05 DIAGNOSIS — K7460 Unspecified cirrhosis of liver: Secondary | ICD-10-CM | POA: Diagnosis not present

## 2019-10-06 DIAGNOSIS — R4182 Altered mental status, unspecified: Secondary | ICD-10-CM | POA: Diagnosis not present

## 2019-10-07 DIAGNOSIS — I2510 Atherosclerotic heart disease of native coronary artery without angina pectoris: Secondary | ICD-10-CM | POA: Diagnosis not present

## 2019-10-07 DIAGNOSIS — K766 Portal hypertension: Secondary | ICD-10-CM | POA: Diagnosis not present

## 2019-10-07 DIAGNOSIS — K7460 Unspecified cirrhosis of liver: Secondary | ICD-10-CM | POA: Diagnosis not present

## 2019-10-07 DIAGNOSIS — I69354 Hemiplegia and hemiparesis following cerebral infarction affecting left non-dominant side: Secondary | ICD-10-CM | POA: Diagnosis not present

## 2019-10-07 DIAGNOSIS — Z8616 Personal history of COVID-19: Secondary | ICD-10-CM | POA: Diagnosis not present

## 2019-10-07 DIAGNOSIS — J90 Pleural effusion, not elsewhere classified: Secondary | ICD-10-CM | POA: Diagnosis not present

## 2019-10-07 DIAGNOSIS — E785 Hyperlipidemia, unspecified: Secondary | ICD-10-CM | POA: Diagnosis not present

## 2019-10-07 DIAGNOSIS — E7220 Disorder of urea cycle metabolism, unspecified: Secondary | ICD-10-CM | POA: Diagnosis not present

## 2019-10-07 DIAGNOSIS — K219 Gastro-esophageal reflux disease without esophagitis: Secondary | ICD-10-CM | POA: Diagnosis not present

## 2019-10-07 DIAGNOSIS — D696 Thrombocytopenia, unspecified: Secondary | ICD-10-CM | POA: Diagnosis not present

## 2019-10-07 DIAGNOSIS — D509 Iron deficiency anemia, unspecified: Secondary | ICD-10-CM | POA: Diagnosis not present

## 2019-10-07 DIAGNOSIS — I480 Paroxysmal atrial fibrillation: Secondary | ICD-10-CM | POA: Diagnosis not present

## 2019-10-07 DIAGNOSIS — I272 Pulmonary hypertension, unspecified: Secondary | ICD-10-CM | POA: Diagnosis not present

## 2019-10-07 DIAGNOSIS — I739 Peripheral vascular disease, unspecified: Secondary | ICD-10-CM | POA: Diagnosis not present

## 2019-10-08 DIAGNOSIS — I4891 Unspecified atrial fibrillation: Secondary | ICD-10-CM | POA: Diagnosis not present

## 2019-10-08 DIAGNOSIS — K7460 Unspecified cirrhosis of liver: Secondary | ICD-10-CM | POA: Diagnosis not present

## 2019-10-09 DIAGNOSIS — K766 Portal hypertension: Secondary | ICD-10-CM | POA: Diagnosis not present

## 2019-10-09 DIAGNOSIS — Z4682 Encounter for fitting and adjustment of non-vascular catheter: Secondary | ICD-10-CM | POA: Diagnosis not present

## 2019-10-09 DIAGNOSIS — K7290 Hepatic failure, unspecified without coma: Secondary | ICD-10-CM | POA: Diagnosis not present

## 2019-10-09 DIAGNOSIS — Z23 Encounter for immunization: Secondary | ICD-10-CM | POA: Diagnosis not present

## 2019-10-09 DIAGNOSIS — I8510 Secondary esophageal varices without bleeding: Secondary | ICD-10-CM | POA: Diagnosis not present

## 2019-10-09 DIAGNOSIS — E860 Dehydration: Secondary | ICD-10-CM | POA: Diagnosis not present

## 2019-10-09 DIAGNOSIS — I10 Essential (primary) hypertension: Secondary | ICD-10-CM

## 2019-10-09 DIAGNOSIS — R404 Transient alteration of awareness: Secondary | ICD-10-CM | POA: Diagnosis not present

## 2019-10-09 DIAGNOSIS — I252 Old myocardial infarction: Secondary | ICD-10-CM | POA: Diagnosis not present

## 2019-10-09 DIAGNOSIS — Z79899 Other long term (current) drug therapy: Secondary | ICD-10-CM | POA: Diagnosis not present

## 2019-10-09 DIAGNOSIS — R4020 Unspecified coma: Secondary | ICD-10-CM | POA: Diagnosis not present

## 2019-10-09 DIAGNOSIS — D61818 Other pancytopenia: Secondary | ICD-10-CM | POA: Diagnosis not present

## 2019-10-09 DIAGNOSIS — I482 Chronic atrial fibrillation, unspecified: Secondary | ICD-10-CM

## 2019-10-09 DIAGNOSIS — R001 Bradycardia, unspecified: Secondary | ICD-10-CM

## 2019-10-09 DIAGNOSIS — I4891 Unspecified atrial fibrillation: Secondary | ICD-10-CM | POA: Diagnosis not present

## 2019-10-09 DIAGNOSIS — K7200 Acute and subacute hepatic failure without coma: Secondary | ICD-10-CM

## 2019-10-09 DIAGNOSIS — E873 Alkalosis: Secondary | ICD-10-CM | POA: Diagnosis not present

## 2019-10-09 DIAGNOSIS — Z8673 Personal history of transient ischemic attack (TIA), and cerebral infarction without residual deficits: Secondary | ICD-10-CM | POA: Diagnosis not present

## 2019-10-09 DIAGNOSIS — K7460 Unspecified cirrhosis of liver: Secondary | ICD-10-CM | POA: Diagnosis not present

## 2019-10-09 DIAGNOSIS — Z66 Do not resuscitate: Secondary | ICD-10-CM | POA: Diagnosis not present

## 2019-10-09 DIAGNOSIS — Z743 Need for continuous supervision: Secondary | ICD-10-CM | POA: Diagnosis not present

## 2019-10-09 DIAGNOSIS — I2510 Atherosclerotic heart disease of native coronary artery without angina pectoris: Secondary | ICD-10-CM | POA: Diagnosis not present

## 2019-10-09 DIAGNOSIS — T68XXXA Hypothermia, initial encounter: Secondary | ICD-10-CM | POA: Diagnosis not present

## 2019-10-09 DIAGNOSIS — K219 Gastro-esophageal reflux disease without esophagitis: Secondary | ICD-10-CM | POA: Diagnosis not present

## 2019-10-10 DIAGNOSIS — I482 Chronic atrial fibrillation, unspecified: Secondary | ICD-10-CM | POA: Diagnosis not present

## 2019-10-10 DIAGNOSIS — T68XXXA Hypothermia, initial encounter: Secondary | ICD-10-CM | POA: Diagnosis not present

## 2019-10-10 DIAGNOSIS — K7290 Hepatic failure, unspecified without coma: Secondary | ICD-10-CM | POA: Diagnosis not present

## 2019-10-10 DIAGNOSIS — R001 Bradycardia, unspecified: Secondary | ICD-10-CM | POA: Diagnosis not present

## 2019-10-10 DIAGNOSIS — I10 Essential (primary) hypertension: Secondary | ICD-10-CM | POA: Diagnosis not present

## 2019-10-10 DIAGNOSIS — K7200 Acute and subacute hepatic failure without coma: Secondary | ICD-10-CM | POA: Diagnosis not present

## 2019-10-10 DIAGNOSIS — I4891 Unspecified atrial fibrillation: Secondary | ICD-10-CM | POA: Diagnosis not present

## 2019-10-10 DIAGNOSIS — K7460 Unspecified cirrhosis of liver: Secondary | ICD-10-CM | POA: Diagnosis not present

## 2019-10-11 DIAGNOSIS — I482 Chronic atrial fibrillation, unspecified: Secondary | ICD-10-CM | POA: Diagnosis not present

## 2019-10-11 DIAGNOSIS — K7200 Acute and subacute hepatic failure without coma: Secondary | ICD-10-CM | POA: Diagnosis not present

## 2019-10-11 DIAGNOSIS — I10 Essential (primary) hypertension: Secondary | ICD-10-CM | POA: Diagnosis not present

## 2019-10-11 DIAGNOSIS — R001 Bradycardia, unspecified: Secondary | ICD-10-CM | POA: Diagnosis not present

## 2019-10-12 DIAGNOSIS — I482 Chronic atrial fibrillation, unspecified: Secondary | ICD-10-CM | POA: Diagnosis not present

## 2019-10-12 DIAGNOSIS — K7200 Acute and subacute hepatic failure without coma: Secondary | ICD-10-CM | POA: Diagnosis not present

## 2019-10-12 DIAGNOSIS — R001 Bradycardia, unspecified: Secondary | ICD-10-CM | POA: Diagnosis not present

## 2019-10-12 DIAGNOSIS — I10 Essential (primary) hypertension: Secondary | ICD-10-CM | POA: Diagnosis not present

## 2019-10-13 DIAGNOSIS — I272 Pulmonary hypertension, unspecified: Secondary | ICD-10-CM | POA: Diagnosis not present

## 2019-10-13 DIAGNOSIS — E7220 Disorder of urea cycle metabolism, unspecified: Secondary | ICD-10-CM | POA: Diagnosis not present

## 2019-10-13 DIAGNOSIS — D696 Thrombocytopenia, unspecified: Secondary | ICD-10-CM | POA: Diagnosis not present

## 2019-10-13 DIAGNOSIS — I480 Paroxysmal atrial fibrillation: Secondary | ICD-10-CM | POA: Diagnosis not present

## 2019-10-13 DIAGNOSIS — Z8616 Personal history of COVID-19: Secondary | ICD-10-CM | POA: Diagnosis not present

## 2019-10-13 DIAGNOSIS — K219 Gastro-esophageal reflux disease without esophagitis: Secondary | ICD-10-CM | POA: Diagnosis not present

## 2019-10-13 DIAGNOSIS — I2510 Atherosclerotic heart disease of native coronary artery without angina pectoris: Secondary | ICD-10-CM | POA: Diagnosis not present

## 2019-10-13 DIAGNOSIS — E785 Hyperlipidemia, unspecified: Secondary | ICD-10-CM | POA: Diagnosis not present

## 2019-10-13 DIAGNOSIS — K7460 Unspecified cirrhosis of liver: Secondary | ICD-10-CM | POA: Diagnosis not present

## 2019-10-13 DIAGNOSIS — J90 Pleural effusion, not elsewhere classified: Secondary | ICD-10-CM | POA: Diagnosis not present

## 2019-10-13 DIAGNOSIS — K766 Portal hypertension: Secondary | ICD-10-CM | POA: Diagnosis not present

## 2019-10-13 DIAGNOSIS — D509 Iron deficiency anemia, unspecified: Secondary | ICD-10-CM | POA: Diagnosis not present

## 2019-10-13 DIAGNOSIS — I69354 Hemiplegia and hemiparesis following cerebral infarction affecting left non-dominant side: Secondary | ICD-10-CM | POA: Diagnosis not present

## 2019-10-13 DIAGNOSIS — I739 Peripheral vascular disease, unspecified: Secondary | ICD-10-CM | POA: Diagnosis not present

## 2019-10-14 DIAGNOSIS — J90 Pleural effusion, not elsewhere classified: Secondary | ICD-10-CM | POA: Diagnosis not present

## 2019-10-14 DIAGNOSIS — I2510 Atherosclerotic heart disease of native coronary artery without angina pectoris: Secondary | ICD-10-CM | POA: Diagnosis not present

## 2019-10-14 DIAGNOSIS — K7460 Unspecified cirrhosis of liver: Secondary | ICD-10-CM | POA: Diagnosis not present

## 2019-10-14 DIAGNOSIS — E7220 Disorder of urea cycle metabolism, unspecified: Secondary | ICD-10-CM | POA: Diagnosis not present

## 2019-10-14 DIAGNOSIS — D696 Thrombocytopenia, unspecified: Secondary | ICD-10-CM | POA: Diagnosis not present

## 2019-10-14 DIAGNOSIS — I69354 Hemiplegia and hemiparesis following cerebral infarction affecting left non-dominant side: Secondary | ICD-10-CM | POA: Diagnosis not present

## 2019-10-14 DIAGNOSIS — K219 Gastro-esophageal reflux disease without esophagitis: Secondary | ICD-10-CM | POA: Diagnosis not present

## 2019-10-14 DIAGNOSIS — E785 Hyperlipidemia, unspecified: Secondary | ICD-10-CM | POA: Diagnosis not present

## 2019-10-14 DIAGNOSIS — K766 Portal hypertension: Secondary | ICD-10-CM | POA: Diagnosis not present

## 2019-10-14 DIAGNOSIS — D509 Iron deficiency anemia, unspecified: Secondary | ICD-10-CM | POA: Diagnosis not present

## 2019-10-14 DIAGNOSIS — I272 Pulmonary hypertension, unspecified: Secondary | ICD-10-CM | POA: Diagnosis not present

## 2019-10-14 DIAGNOSIS — I480 Paroxysmal atrial fibrillation: Secondary | ICD-10-CM | POA: Diagnosis not present

## 2019-10-14 DIAGNOSIS — I739 Peripheral vascular disease, unspecified: Secondary | ICD-10-CM | POA: Diagnosis not present

## 2019-10-14 DIAGNOSIS — Z8616 Personal history of COVID-19: Secondary | ICD-10-CM | POA: Diagnosis not present

## 2019-10-15 DIAGNOSIS — K7460 Unspecified cirrhosis of liver: Secondary | ICD-10-CM | POA: Diagnosis not present

## 2019-10-15 DIAGNOSIS — Z8616 Personal history of COVID-19: Secondary | ICD-10-CM | POA: Diagnosis not present

## 2019-10-15 DIAGNOSIS — K766 Portal hypertension: Secondary | ICD-10-CM | POA: Diagnosis not present

## 2019-10-15 DIAGNOSIS — I739 Peripheral vascular disease, unspecified: Secondary | ICD-10-CM | POA: Diagnosis not present

## 2019-10-15 DIAGNOSIS — K219 Gastro-esophageal reflux disease without esophagitis: Secondary | ICD-10-CM | POA: Diagnosis not present

## 2019-10-15 DIAGNOSIS — D696 Thrombocytopenia, unspecified: Secondary | ICD-10-CM | POA: Diagnosis not present

## 2019-10-15 DIAGNOSIS — I480 Paroxysmal atrial fibrillation: Secondary | ICD-10-CM | POA: Diagnosis not present

## 2019-10-15 DIAGNOSIS — I2510 Atherosclerotic heart disease of native coronary artery without angina pectoris: Secondary | ICD-10-CM | POA: Diagnosis not present

## 2019-10-15 DIAGNOSIS — I69354 Hemiplegia and hemiparesis following cerebral infarction affecting left non-dominant side: Secondary | ICD-10-CM | POA: Diagnosis not present

## 2019-10-15 DIAGNOSIS — I272 Pulmonary hypertension, unspecified: Secondary | ICD-10-CM | POA: Diagnosis not present

## 2019-10-15 DIAGNOSIS — E785 Hyperlipidemia, unspecified: Secondary | ICD-10-CM | POA: Diagnosis not present

## 2019-10-15 DIAGNOSIS — J90 Pleural effusion, not elsewhere classified: Secondary | ICD-10-CM | POA: Diagnosis not present

## 2019-10-15 DIAGNOSIS — E7220 Disorder of urea cycle metabolism, unspecified: Secondary | ICD-10-CM | POA: Diagnosis not present

## 2019-10-15 DIAGNOSIS — D509 Iron deficiency anemia, unspecified: Secondary | ICD-10-CM | POA: Diagnosis not present

## 2019-10-16 DIAGNOSIS — D510 Vitamin B12 deficiency anemia due to intrinsic factor deficiency: Secondary | ICD-10-CM | POA: Diagnosis not present

## 2019-10-16 DIAGNOSIS — K766 Portal hypertension: Secondary | ICD-10-CM | POA: Diagnosis not present

## 2019-10-16 DIAGNOSIS — E785 Hyperlipidemia, unspecified: Secondary | ICD-10-CM | POA: Diagnosis not present

## 2019-10-16 DIAGNOSIS — I2510 Atherosclerotic heart disease of native coronary artery without angina pectoris: Secondary | ICD-10-CM | POA: Diagnosis not present

## 2019-10-16 DIAGNOSIS — I69354 Hemiplegia and hemiparesis following cerebral infarction affecting left non-dominant side: Secondary | ICD-10-CM | POA: Diagnosis not present

## 2019-10-16 DIAGNOSIS — K7460 Unspecified cirrhosis of liver: Secondary | ICD-10-CM | POA: Diagnosis not present

## 2019-10-16 DIAGNOSIS — D696 Thrombocytopenia, unspecified: Secondary | ICD-10-CM | POA: Diagnosis not present

## 2019-10-16 DIAGNOSIS — J90 Pleural effusion, not elsewhere classified: Secondary | ICD-10-CM | POA: Diagnosis not present

## 2019-10-16 DIAGNOSIS — I4891 Unspecified atrial fibrillation: Secondary | ICD-10-CM | POA: Diagnosis not present

## 2019-10-16 DIAGNOSIS — I480 Paroxysmal atrial fibrillation: Secondary | ICD-10-CM | POA: Diagnosis not present

## 2019-10-16 DIAGNOSIS — K219 Gastro-esophageal reflux disease without esophagitis: Secondary | ICD-10-CM | POA: Diagnosis not present

## 2019-10-16 DIAGNOSIS — D509 Iron deficiency anemia, unspecified: Secondary | ICD-10-CM | POA: Diagnosis not present

## 2019-10-16 DIAGNOSIS — Z8616 Personal history of COVID-19: Secondary | ICD-10-CM | POA: Diagnosis not present

## 2019-10-16 DIAGNOSIS — E7220 Disorder of urea cycle metabolism, unspecified: Secondary | ICD-10-CM | POA: Diagnosis not present

## 2019-10-16 DIAGNOSIS — I272 Pulmonary hypertension, unspecified: Secondary | ICD-10-CM | POA: Diagnosis not present

## 2019-10-16 DIAGNOSIS — I739 Peripheral vascular disease, unspecified: Secondary | ICD-10-CM | POA: Diagnosis not present

## 2019-10-19 DIAGNOSIS — I480 Paroxysmal atrial fibrillation: Secondary | ICD-10-CM | POA: Diagnosis not present

## 2019-10-19 DIAGNOSIS — I739 Peripheral vascular disease, unspecified: Secondary | ICD-10-CM | POA: Diagnosis not present

## 2019-10-19 DIAGNOSIS — K219 Gastro-esophageal reflux disease without esophagitis: Secondary | ICD-10-CM | POA: Diagnosis not present

## 2019-10-19 DIAGNOSIS — D696 Thrombocytopenia, unspecified: Secondary | ICD-10-CM | POA: Diagnosis not present

## 2019-10-19 DIAGNOSIS — E7220 Disorder of urea cycle metabolism, unspecified: Secondary | ICD-10-CM | POA: Diagnosis not present

## 2019-10-19 DIAGNOSIS — E785 Hyperlipidemia, unspecified: Secondary | ICD-10-CM | POA: Diagnosis not present

## 2019-10-19 DIAGNOSIS — I272 Pulmonary hypertension, unspecified: Secondary | ICD-10-CM | POA: Diagnosis not present

## 2019-10-19 DIAGNOSIS — K766 Portal hypertension: Secondary | ICD-10-CM | POA: Diagnosis not present

## 2019-10-19 DIAGNOSIS — J90 Pleural effusion, not elsewhere classified: Secondary | ICD-10-CM | POA: Diagnosis not present

## 2019-10-19 DIAGNOSIS — K7460 Unspecified cirrhosis of liver: Secondary | ICD-10-CM | POA: Diagnosis not present

## 2019-10-19 DIAGNOSIS — I2510 Atherosclerotic heart disease of native coronary artery without angina pectoris: Secondary | ICD-10-CM | POA: Diagnosis not present

## 2019-10-19 DIAGNOSIS — I69354 Hemiplegia and hemiparesis following cerebral infarction affecting left non-dominant side: Secondary | ICD-10-CM | POA: Diagnosis not present

## 2019-10-19 DIAGNOSIS — D509 Iron deficiency anemia, unspecified: Secondary | ICD-10-CM | POA: Diagnosis not present

## 2019-10-19 DIAGNOSIS — Z8616 Personal history of COVID-19: Secondary | ICD-10-CM | POA: Diagnosis not present

## 2019-10-20 DIAGNOSIS — K7460 Unspecified cirrhosis of liver: Secondary | ICD-10-CM | POA: Diagnosis not present

## 2019-10-20 DIAGNOSIS — I480 Paroxysmal atrial fibrillation: Secondary | ICD-10-CM | POA: Diagnosis not present

## 2019-10-20 DIAGNOSIS — J90 Pleural effusion, not elsewhere classified: Secondary | ICD-10-CM | POA: Diagnosis not present

## 2019-10-20 DIAGNOSIS — E785 Hyperlipidemia, unspecified: Secondary | ICD-10-CM | POA: Diagnosis not present

## 2019-10-20 DIAGNOSIS — E7220 Disorder of urea cycle metabolism, unspecified: Secondary | ICD-10-CM | POA: Diagnosis not present

## 2019-10-20 DIAGNOSIS — Z8616 Personal history of COVID-19: Secondary | ICD-10-CM | POA: Diagnosis not present

## 2019-10-20 DIAGNOSIS — K766 Portal hypertension: Secondary | ICD-10-CM | POA: Diagnosis not present

## 2019-10-20 DIAGNOSIS — D696 Thrombocytopenia, unspecified: Secondary | ICD-10-CM | POA: Diagnosis not present

## 2019-10-20 DIAGNOSIS — I2510 Atherosclerotic heart disease of native coronary artery without angina pectoris: Secondary | ICD-10-CM | POA: Diagnosis not present

## 2019-10-20 DIAGNOSIS — I739 Peripheral vascular disease, unspecified: Secondary | ICD-10-CM | POA: Diagnosis not present

## 2019-10-20 DIAGNOSIS — I69354 Hemiplegia and hemiparesis following cerebral infarction affecting left non-dominant side: Secondary | ICD-10-CM | POA: Diagnosis not present

## 2019-10-20 DIAGNOSIS — K219 Gastro-esophageal reflux disease without esophagitis: Secondary | ICD-10-CM | POA: Diagnosis not present

## 2019-10-20 DIAGNOSIS — I272 Pulmonary hypertension, unspecified: Secondary | ICD-10-CM | POA: Diagnosis not present

## 2019-10-20 DIAGNOSIS — D509 Iron deficiency anemia, unspecified: Secondary | ICD-10-CM | POA: Diagnosis not present

## 2019-10-22 DIAGNOSIS — D696 Thrombocytopenia, unspecified: Secondary | ICD-10-CM | POA: Diagnosis not present

## 2019-10-22 DIAGNOSIS — I69354 Hemiplegia and hemiparesis following cerebral infarction affecting left non-dominant side: Secondary | ICD-10-CM | POA: Diagnosis not present

## 2019-10-22 DIAGNOSIS — E7220 Disorder of urea cycle metabolism, unspecified: Secondary | ICD-10-CM | POA: Diagnosis not present

## 2019-10-22 DIAGNOSIS — I739 Peripheral vascular disease, unspecified: Secondary | ICD-10-CM | POA: Diagnosis not present

## 2019-10-22 DIAGNOSIS — I272 Pulmonary hypertension, unspecified: Secondary | ICD-10-CM | POA: Diagnosis not present

## 2019-10-22 DIAGNOSIS — K766 Portal hypertension: Secondary | ICD-10-CM | POA: Diagnosis not present

## 2019-10-22 DIAGNOSIS — K219 Gastro-esophageal reflux disease without esophagitis: Secondary | ICD-10-CM | POA: Diagnosis not present

## 2019-10-22 DIAGNOSIS — K7460 Unspecified cirrhosis of liver: Secondary | ICD-10-CM | POA: Diagnosis not present

## 2019-10-22 DIAGNOSIS — Z8616 Personal history of COVID-19: Secondary | ICD-10-CM | POA: Diagnosis not present

## 2019-10-22 DIAGNOSIS — E785 Hyperlipidemia, unspecified: Secondary | ICD-10-CM | POA: Diagnosis not present

## 2019-10-22 DIAGNOSIS — J90 Pleural effusion, not elsewhere classified: Secondary | ICD-10-CM | POA: Diagnosis not present

## 2019-10-22 DIAGNOSIS — I480 Paroxysmal atrial fibrillation: Secondary | ICD-10-CM | POA: Diagnosis not present

## 2019-10-22 DIAGNOSIS — D509 Iron deficiency anemia, unspecified: Secondary | ICD-10-CM | POA: Diagnosis not present

## 2019-10-22 DIAGNOSIS — I2510 Atherosclerotic heart disease of native coronary artery without angina pectoris: Secondary | ICD-10-CM | POA: Diagnosis not present

## 2019-10-26 DIAGNOSIS — D509 Iron deficiency anemia, unspecified: Secondary | ICD-10-CM | POA: Diagnosis not present

## 2019-10-26 DIAGNOSIS — D696 Thrombocytopenia, unspecified: Secondary | ICD-10-CM | POA: Diagnosis not present

## 2019-10-26 DIAGNOSIS — Z8616 Personal history of COVID-19: Secondary | ICD-10-CM | POA: Diagnosis not present

## 2019-10-26 DIAGNOSIS — I69354 Hemiplegia and hemiparesis following cerebral infarction affecting left non-dominant side: Secondary | ICD-10-CM | POA: Diagnosis not present

## 2019-10-26 DIAGNOSIS — I2510 Atherosclerotic heart disease of native coronary artery without angina pectoris: Secondary | ICD-10-CM | POA: Diagnosis not present

## 2019-10-26 DIAGNOSIS — E785 Hyperlipidemia, unspecified: Secondary | ICD-10-CM | POA: Diagnosis not present

## 2019-10-26 DIAGNOSIS — K7460 Unspecified cirrhosis of liver: Secondary | ICD-10-CM | POA: Diagnosis not present

## 2019-10-26 DIAGNOSIS — E7220 Disorder of urea cycle metabolism, unspecified: Secondary | ICD-10-CM | POA: Diagnosis not present

## 2019-10-26 DIAGNOSIS — I480 Paroxysmal atrial fibrillation: Secondary | ICD-10-CM | POA: Diagnosis not present

## 2019-10-26 DIAGNOSIS — J90 Pleural effusion, not elsewhere classified: Secondary | ICD-10-CM | POA: Diagnosis not present

## 2019-10-26 DIAGNOSIS — K766 Portal hypertension: Secondary | ICD-10-CM | POA: Diagnosis not present

## 2019-10-26 DIAGNOSIS — K219 Gastro-esophageal reflux disease without esophagitis: Secondary | ICD-10-CM | POA: Diagnosis not present

## 2019-10-26 DIAGNOSIS — I272 Pulmonary hypertension, unspecified: Secondary | ICD-10-CM | POA: Diagnosis not present

## 2019-10-26 DIAGNOSIS — I739 Peripheral vascular disease, unspecified: Secondary | ICD-10-CM | POA: Diagnosis not present

## 2019-10-27 DIAGNOSIS — I2510 Atherosclerotic heart disease of native coronary artery without angina pectoris: Secondary | ICD-10-CM | POA: Diagnosis not present

## 2019-10-27 DIAGNOSIS — K219 Gastro-esophageal reflux disease without esophagitis: Secondary | ICD-10-CM | POA: Diagnosis not present

## 2019-10-27 DIAGNOSIS — I480 Paroxysmal atrial fibrillation: Secondary | ICD-10-CM | POA: Diagnosis not present

## 2019-10-27 DIAGNOSIS — I272 Pulmonary hypertension, unspecified: Secondary | ICD-10-CM | POA: Diagnosis not present

## 2019-10-27 DIAGNOSIS — J90 Pleural effusion, not elsewhere classified: Secondary | ICD-10-CM | POA: Diagnosis not present

## 2019-10-27 DIAGNOSIS — D509 Iron deficiency anemia, unspecified: Secondary | ICD-10-CM | POA: Diagnosis not present

## 2019-10-27 DIAGNOSIS — K7460 Unspecified cirrhosis of liver: Secondary | ICD-10-CM | POA: Diagnosis not present

## 2019-10-27 DIAGNOSIS — E7220 Disorder of urea cycle metabolism, unspecified: Secondary | ICD-10-CM | POA: Diagnosis not present

## 2019-10-27 DIAGNOSIS — E785 Hyperlipidemia, unspecified: Secondary | ICD-10-CM | POA: Diagnosis not present

## 2019-10-27 DIAGNOSIS — K766 Portal hypertension: Secondary | ICD-10-CM | POA: Diagnosis not present

## 2019-10-27 DIAGNOSIS — D696 Thrombocytopenia, unspecified: Secondary | ICD-10-CM | POA: Diagnosis not present

## 2019-10-27 DIAGNOSIS — I69354 Hemiplegia and hemiparesis following cerebral infarction affecting left non-dominant side: Secondary | ICD-10-CM | POA: Diagnosis not present

## 2019-10-27 DIAGNOSIS — Z8616 Personal history of COVID-19: Secondary | ICD-10-CM | POA: Diagnosis not present

## 2019-10-27 DIAGNOSIS — I739 Peripheral vascular disease, unspecified: Secondary | ICD-10-CM | POA: Diagnosis not present

## 2019-10-29 DIAGNOSIS — Z8616 Personal history of COVID-19: Secondary | ICD-10-CM | POA: Diagnosis not present

## 2019-10-29 DIAGNOSIS — D696 Thrombocytopenia, unspecified: Secondary | ICD-10-CM | POA: Diagnosis not present

## 2019-10-29 DIAGNOSIS — D509 Iron deficiency anemia, unspecified: Secondary | ICD-10-CM | POA: Diagnosis not present

## 2019-10-29 DIAGNOSIS — I739 Peripheral vascular disease, unspecified: Secondary | ICD-10-CM | POA: Diagnosis not present

## 2019-10-29 DIAGNOSIS — I272 Pulmonary hypertension, unspecified: Secondary | ICD-10-CM | POA: Diagnosis not present

## 2019-10-29 DIAGNOSIS — I2510 Atherosclerotic heart disease of native coronary artery without angina pectoris: Secondary | ICD-10-CM | POA: Diagnosis not present

## 2019-10-29 DIAGNOSIS — K7460 Unspecified cirrhosis of liver: Secondary | ICD-10-CM | POA: Diagnosis not present

## 2019-10-29 DIAGNOSIS — K766 Portal hypertension: Secondary | ICD-10-CM | POA: Diagnosis not present

## 2019-10-29 DIAGNOSIS — K219 Gastro-esophageal reflux disease without esophagitis: Secondary | ICD-10-CM | POA: Diagnosis not present

## 2019-10-29 DIAGNOSIS — I69354 Hemiplegia and hemiparesis following cerebral infarction affecting left non-dominant side: Secondary | ICD-10-CM | POA: Diagnosis not present

## 2019-10-29 DIAGNOSIS — I480 Paroxysmal atrial fibrillation: Secondary | ICD-10-CM | POA: Diagnosis not present

## 2019-10-29 DIAGNOSIS — E7220 Disorder of urea cycle metabolism, unspecified: Secondary | ICD-10-CM | POA: Diagnosis not present

## 2019-10-29 DIAGNOSIS — J90 Pleural effusion, not elsewhere classified: Secondary | ICD-10-CM | POA: Diagnosis not present

## 2019-10-29 DIAGNOSIS — E785 Hyperlipidemia, unspecified: Secondary | ICD-10-CM | POA: Diagnosis not present

## 2019-10-30 DIAGNOSIS — I480 Paroxysmal atrial fibrillation: Secondary | ICD-10-CM | POA: Diagnosis not present

## 2019-10-30 DIAGNOSIS — J90 Pleural effusion, not elsewhere classified: Secondary | ICD-10-CM | POA: Diagnosis not present

## 2019-10-30 DIAGNOSIS — I272 Pulmonary hypertension, unspecified: Secondary | ICD-10-CM | POA: Diagnosis not present

## 2019-10-30 DIAGNOSIS — E785 Hyperlipidemia, unspecified: Secondary | ICD-10-CM | POA: Diagnosis not present

## 2019-10-30 DIAGNOSIS — I69354 Hemiplegia and hemiparesis following cerebral infarction affecting left non-dominant side: Secondary | ICD-10-CM | POA: Diagnosis not present

## 2019-10-30 DIAGNOSIS — D696 Thrombocytopenia, unspecified: Secondary | ICD-10-CM | POA: Diagnosis not present

## 2019-10-30 DIAGNOSIS — K7460 Unspecified cirrhosis of liver: Secondary | ICD-10-CM | POA: Diagnosis not present

## 2019-10-30 DIAGNOSIS — I739 Peripheral vascular disease, unspecified: Secondary | ICD-10-CM | POA: Diagnosis not present

## 2019-10-30 DIAGNOSIS — I2510 Atherosclerotic heart disease of native coronary artery without angina pectoris: Secondary | ICD-10-CM | POA: Diagnosis not present

## 2019-10-30 DIAGNOSIS — D509 Iron deficiency anemia, unspecified: Secondary | ICD-10-CM | POA: Diagnosis not present

## 2019-10-30 DIAGNOSIS — E7220 Disorder of urea cycle metabolism, unspecified: Secondary | ICD-10-CM | POA: Diagnosis not present

## 2019-10-30 DIAGNOSIS — K219 Gastro-esophageal reflux disease without esophagitis: Secondary | ICD-10-CM | POA: Diagnosis not present

## 2019-10-30 DIAGNOSIS — Z8616 Personal history of COVID-19: Secondary | ICD-10-CM | POA: Diagnosis not present

## 2019-10-30 DIAGNOSIS — K766 Portal hypertension: Secondary | ICD-10-CM | POA: Diagnosis not present

## 2019-11-02 DIAGNOSIS — D696 Thrombocytopenia, unspecified: Secondary | ICD-10-CM | POA: Diagnosis not present

## 2019-11-02 DIAGNOSIS — E785 Hyperlipidemia, unspecified: Secondary | ICD-10-CM | POA: Diagnosis not present

## 2019-11-02 DIAGNOSIS — I2510 Atherosclerotic heart disease of native coronary artery without angina pectoris: Secondary | ICD-10-CM | POA: Diagnosis not present

## 2019-11-02 DIAGNOSIS — J90 Pleural effusion, not elsewhere classified: Secondary | ICD-10-CM | POA: Diagnosis not present

## 2019-11-02 DIAGNOSIS — I69354 Hemiplegia and hemiparesis following cerebral infarction affecting left non-dominant side: Secondary | ICD-10-CM | POA: Diagnosis not present

## 2019-11-02 DIAGNOSIS — Z8616 Personal history of COVID-19: Secondary | ICD-10-CM | POA: Diagnosis not present

## 2019-11-02 DIAGNOSIS — K766 Portal hypertension: Secondary | ICD-10-CM | POA: Diagnosis not present

## 2019-11-02 DIAGNOSIS — D509 Iron deficiency anemia, unspecified: Secondary | ICD-10-CM | POA: Diagnosis not present

## 2019-11-02 DIAGNOSIS — I272 Pulmonary hypertension, unspecified: Secondary | ICD-10-CM | POA: Diagnosis not present

## 2019-11-02 DIAGNOSIS — K7460 Unspecified cirrhosis of liver: Secondary | ICD-10-CM | POA: Diagnosis not present

## 2019-11-02 DIAGNOSIS — I480 Paroxysmal atrial fibrillation: Secondary | ICD-10-CM | POA: Diagnosis not present

## 2019-11-02 DIAGNOSIS — K219 Gastro-esophageal reflux disease without esophagitis: Secondary | ICD-10-CM | POA: Diagnosis not present

## 2019-11-02 DIAGNOSIS — E7220 Disorder of urea cycle metabolism, unspecified: Secondary | ICD-10-CM | POA: Diagnosis not present

## 2019-11-02 DIAGNOSIS — I739 Peripheral vascular disease, unspecified: Secondary | ICD-10-CM | POA: Diagnosis not present

## 2019-11-05 DIAGNOSIS — I480 Paroxysmal atrial fibrillation: Secondary | ICD-10-CM | POA: Diagnosis not present

## 2019-11-05 DIAGNOSIS — Z8616 Personal history of COVID-19: Secondary | ICD-10-CM | POA: Diagnosis not present

## 2019-11-05 DIAGNOSIS — J90 Pleural effusion, not elsewhere classified: Secondary | ICD-10-CM | POA: Diagnosis not present

## 2019-11-05 DIAGNOSIS — K766 Portal hypertension: Secondary | ICD-10-CM | POA: Diagnosis not present

## 2019-11-05 DIAGNOSIS — I739 Peripheral vascular disease, unspecified: Secondary | ICD-10-CM | POA: Diagnosis not present

## 2019-11-05 DIAGNOSIS — I272 Pulmonary hypertension, unspecified: Secondary | ICD-10-CM | POA: Diagnosis not present

## 2019-11-05 DIAGNOSIS — K219 Gastro-esophageal reflux disease without esophagitis: Secondary | ICD-10-CM | POA: Diagnosis not present

## 2019-11-05 DIAGNOSIS — K7460 Unspecified cirrhosis of liver: Secondary | ICD-10-CM | POA: Diagnosis not present

## 2019-11-05 DIAGNOSIS — E7220 Disorder of urea cycle metabolism, unspecified: Secondary | ICD-10-CM | POA: Diagnosis not present

## 2019-11-05 DIAGNOSIS — I69354 Hemiplegia and hemiparesis following cerebral infarction affecting left non-dominant side: Secondary | ICD-10-CM | POA: Diagnosis not present

## 2019-11-05 DIAGNOSIS — I2510 Atherosclerotic heart disease of native coronary artery without angina pectoris: Secondary | ICD-10-CM | POA: Diagnosis not present

## 2019-11-05 DIAGNOSIS — D509 Iron deficiency anemia, unspecified: Secondary | ICD-10-CM | POA: Diagnosis not present

## 2019-11-05 DIAGNOSIS — E785 Hyperlipidemia, unspecified: Secondary | ICD-10-CM | POA: Diagnosis not present

## 2019-11-05 DIAGNOSIS — D696 Thrombocytopenia, unspecified: Secondary | ICD-10-CM | POA: Diagnosis not present

## 2019-12-11 DIAGNOSIS — I4891 Unspecified atrial fibrillation: Secondary | ICD-10-CM | POA: Diagnosis not present

## 2019-12-11 DIAGNOSIS — R188 Other ascites: Secondary | ICD-10-CM | POA: Diagnosis not present

## 2019-12-11 DIAGNOSIS — D519 Vitamin B12 deficiency anemia, unspecified: Secondary | ICD-10-CM | POA: Diagnosis not present

## 2019-12-11 DIAGNOSIS — R161 Splenomegaly, not elsewhere classified: Secondary | ICD-10-CM | POA: Diagnosis not present

## 2019-12-11 DIAGNOSIS — K7460 Unspecified cirrhosis of liver: Secondary | ICD-10-CM | POA: Diagnosis not present

## 2020-02-04 DIAGNOSIS — K219 Gastro-esophageal reflux disease without esophagitis: Secondary | ICD-10-CM | POA: Diagnosis not present

## 2020-02-04 DIAGNOSIS — R1900 Intra-abdominal and pelvic swelling, mass and lump, unspecified site: Secondary | ICD-10-CM | POA: Diagnosis not present

## 2020-02-04 DIAGNOSIS — I8500 Esophageal varices without bleeding: Secondary | ICD-10-CM | POA: Diagnosis not present

## 2020-02-04 DIAGNOSIS — K7460 Unspecified cirrhosis of liver: Secondary | ICD-10-CM | POA: Diagnosis not present

## 2020-02-04 DIAGNOSIS — K7290 Hepatic failure, unspecified without coma: Secondary | ICD-10-CM | POA: Diagnosis not present

## 2020-02-19 DIAGNOSIS — Z8719 Personal history of other diseases of the digestive system: Secondary | ICD-10-CM | POA: Diagnosis not present

## 2020-02-19 DIAGNOSIS — I639 Cerebral infarction, unspecified: Secondary | ICD-10-CM | POA: Diagnosis not present

## 2020-02-19 DIAGNOSIS — I4891 Unspecified atrial fibrillation: Secondary | ICD-10-CM | POA: Diagnosis not present

## 2020-02-19 DIAGNOSIS — I2510 Atherosclerotic heart disease of native coronary artery without angina pectoris: Secondary | ICD-10-CM | POA: Diagnosis not present

## 2020-02-19 DIAGNOSIS — I482 Chronic atrial fibrillation, unspecified: Secondary | ICD-10-CM | POA: Diagnosis not present

## 2020-02-19 DIAGNOSIS — K7460 Unspecified cirrhosis of liver: Secondary | ICD-10-CM | POA: Diagnosis not present

## 2020-02-24 DIAGNOSIS — J90 Pleural effusion, not elsewhere classified: Secondary | ICD-10-CM | POA: Diagnosis not present

## 2020-02-24 DIAGNOSIS — K7689 Other specified diseases of liver: Secondary | ICD-10-CM | POA: Diagnosis not present

## 2020-02-24 DIAGNOSIS — R188 Other ascites: Secondary | ICD-10-CM | POA: Diagnosis not present

## 2020-02-24 DIAGNOSIS — K766 Portal hypertension: Secondary | ICD-10-CM | POA: Diagnosis not present

## 2020-02-24 DIAGNOSIS — I10 Essential (primary) hypertension: Secondary | ICD-10-CM | POA: Diagnosis not present

## 2020-02-24 DIAGNOSIS — K7460 Unspecified cirrhosis of liver: Secondary | ICD-10-CM | POA: Diagnosis not present

## 2020-02-24 DIAGNOSIS — R161 Splenomegaly, not elsewhere classified: Secondary | ICD-10-CM | POA: Diagnosis not present

## 2020-02-25 DIAGNOSIS — R609 Edema, unspecified: Secondary | ICD-10-CM | POA: Diagnosis not present

## 2020-02-25 DIAGNOSIS — R188 Other ascites: Secondary | ICD-10-CM | POA: Diagnosis not present

## 2020-03-10 DIAGNOSIS — E611 Iron deficiency: Secondary | ICD-10-CM | POA: Diagnosis not present

## 2020-03-10 DIAGNOSIS — R188 Other ascites: Secondary | ICD-10-CM | POA: Diagnosis not present

## 2020-03-12 DIAGNOSIS — D519 Vitamin B12 deficiency anemia, unspecified: Secondary | ICD-10-CM | POA: Diagnosis not present

## 2020-03-12 DIAGNOSIS — I4891 Unspecified atrial fibrillation: Secondary | ICD-10-CM | POA: Diagnosis not present

## 2020-03-31 DIAGNOSIS — K219 Gastro-esophageal reflux disease without esophagitis: Secondary | ICD-10-CM | POA: Diagnosis not present

## 2020-03-31 DIAGNOSIS — R188 Other ascites: Secondary | ICD-10-CM | POA: Diagnosis not present

## 2020-03-31 DIAGNOSIS — K7460 Unspecified cirrhosis of liver: Secondary | ICD-10-CM | POA: Diagnosis not present

## 2020-03-31 DIAGNOSIS — I8500 Esophageal varices without bleeding: Secondary | ICD-10-CM | POA: Diagnosis not present

## 2020-03-31 DIAGNOSIS — K7290 Hepatic failure, unspecified without coma: Secondary | ICD-10-CM | POA: Diagnosis not present

## 2020-04-03 DIAGNOSIS — K7581 Nonalcoholic steatohepatitis (NASH): Secondary | ICD-10-CM | POA: Diagnosis not present

## 2020-04-03 DIAGNOSIS — R531 Weakness: Secondary | ICD-10-CM | POA: Diagnosis not present

## 2020-04-03 DIAGNOSIS — M6281 Muscle weakness (generalized): Secondary | ICD-10-CM | POA: Diagnosis not present

## 2020-04-03 DIAGNOSIS — I6340 Cerebral infarction due to embolism of unspecified cerebral artery: Secondary | ICD-10-CM | POA: Diagnosis not present

## 2020-04-03 DIAGNOSIS — K219 Gastro-esophageal reflux disease without esophagitis: Secondary | ICD-10-CM | POA: Diagnosis not present

## 2020-04-03 DIAGNOSIS — M2550 Pain in unspecified joint: Secondary | ICD-10-CM | POA: Diagnosis not present

## 2020-04-03 DIAGNOSIS — J690 Pneumonitis due to inhalation of food and vomit: Secondary | ICD-10-CM | POA: Diagnosis not present

## 2020-04-03 DIAGNOSIS — I252 Old myocardial infarction: Secondary | ICD-10-CM | POA: Diagnosis not present

## 2020-04-03 DIAGNOSIS — I499 Cardiac arrhythmia, unspecified: Secondary | ICD-10-CM | POA: Diagnosis not present

## 2020-04-03 DIAGNOSIS — I69954 Hemiplegia and hemiparesis following unspecified cerebrovascular disease affecting left non-dominant side: Secondary | ICD-10-CM | POA: Diagnosis not present

## 2020-04-03 DIAGNOSIS — K7290 Hepatic failure, unspecified without coma: Secondary | ICD-10-CM | POA: Diagnosis not present

## 2020-04-03 DIAGNOSIS — R6889 Other general symptoms and signs: Secondary | ICD-10-CM | POA: Diagnosis not present

## 2020-04-03 DIAGNOSIS — J189 Pneumonia, unspecified organism: Secondary | ICD-10-CM | POA: Diagnosis not present

## 2020-04-03 DIAGNOSIS — R069 Unspecified abnormalities of breathing: Secondary | ICD-10-CM | POA: Diagnosis not present

## 2020-04-03 DIAGNOSIS — J948 Other specified pleural conditions: Secondary | ICD-10-CM | POA: Diagnosis not present

## 2020-04-03 DIAGNOSIS — Z7401 Bed confinement status: Secondary | ICD-10-CM | POA: Diagnosis not present

## 2020-04-03 DIAGNOSIS — J9811 Atelectasis: Secondary | ICD-10-CM | POA: Diagnosis not present

## 2020-04-03 DIAGNOSIS — R188 Other ascites: Secondary | ICD-10-CM | POA: Diagnosis not present

## 2020-04-03 DIAGNOSIS — I69354 Hemiplegia and hemiparesis following cerebral infarction affecting left non-dominant side: Secondary | ICD-10-CM | POA: Diagnosis not present

## 2020-04-03 DIAGNOSIS — I2510 Atherosclerotic heart disease of native coronary artery without angina pectoris: Secondary | ICD-10-CM | POA: Diagnosis not present

## 2020-04-03 DIAGNOSIS — M6259 Muscle wasting and atrophy, not elsewhere classified, multiple sites: Secondary | ICD-10-CM | POA: Diagnosis not present

## 2020-04-03 DIAGNOSIS — R0902 Hypoxemia: Secondary | ICD-10-CM | POA: Diagnosis not present

## 2020-04-03 DIAGNOSIS — J918 Pleural effusion in other conditions classified elsewhere: Secondary | ICD-10-CM | POA: Diagnosis not present

## 2020-04-03 DIAGNOSIS — R1312 Dysphagia, oropharyngeal phase: Secondary | ICD-10-CM | POA: Diagnosis not present

## 2020-04-03 DIAGNOSIS — Z87891 Personal history of nicotine dependence: Secondary | ICD-10-CM | POA: Diagnosis not present

## 2020-04-03 DIAGNOSIS — J90 Pleural effusion, not elsewhere classified: Secondary | ICD-10-CM | POA: Diagnosis not present

## 2020-04-03 DIAGNOSIS — Z743 Need for continuous supervision: Secondary | ICD-10-CM | POA: Diagnosis not present

## 2020-04-03 DIAGNOSIS — R911 Solitary pulmonary nodule: Secondary | ICD-10-CM | POA: Diagnosis not present

## 2020-04-03 DIAGNOSIS — D649 Anemia, unspecified: Secondary | ICD-10-CM | POA: Diagnosis not present

## 2020-04-03 DIAGNOSIS — Z79899 Other long term (current) drug therapy: Secondary | ICD-10-CM | POA: Diagnosis not present

## 2020-04-03 DIAGNOSIS — I482 Chronic atrial fibrillation, unspecified: Secondary | ICD-10-CM | POA: Diagnosis not present

## 2020-04-03 DIAGNOSIS — K7460 Unspecified cirrhosis of liver: Secondary | ICD-10-CM | POA: Diagnosis not present

## 2020-04-03 DIAGNOSIS — K7469 Other cirrhosis of liver: Secondary | ICD-10-CM | POA: Diagnosis not present

## 2020-04-03 DIAGNOSIS — I10 Essential (primary) hypertension: Secondary | ICD-10-CM | POA: Diagnosis not present

## 2020-04-06 DIAGNOSIS — M2550 Pain in unspecified joint: Secondary | ICD-10-CM | POA: Diagnosis not present

## 2020-04-06 DIAGNOSIS — J189 Pneumonia, unspecified organism: Secondary | ICD-10-CM | POA: Diagnosis not present

## 2020-04-06 DIAGNOSIS — I69354 Hemiplegia and hemiparesis following cerebral infarction affecting left non-dominant side: Secondary | ICD-10-CM | POA: Diagnosis not present

## 2020-04-06 DIAGNOSIS — J690 Pneumonitis due to inhalation of food and vomit: Secondary | ICD-10-CM | POA: Diagnosis not present

## 2020-04-06 DIAGNOSIS — M6281 Muscle weakness (generalized): Secondary | ICD-10-CM | POA: Diagnosis not present

## 2020-04-06 DIAGNOSIS — J90 Pleural effusion, not elsewhere classified: Secondary | ICD-10-CM | POA: Diagnosis not present

## 2020-04-06 DIAGNOSIS — K7290 Hepatic failure, unspecified without coma: Secondary | ICD-10-CM | POA: Diagnosis not present

## 2020-04-06 DIAGNOSIS — I10 Essential (primary) hypertension: Secondary | ICD-10-CM | POA: Diagnosis not present

## 2020-04-06 DIAGNOSIS — Z7401 Bed confinement status: Secondary | ICD-10-CM | POA: Diagnosis not present

## 2020-04-06 DIAGNOSIS — Z1159 Encounter for screening for other viral diseases: Secondary | ICD-10-CM | POA: Diagnosis not present

## 2020-04-06 DIAGNOSIS — Z743 Need for continuous supervision: Secondary | ICD-10-CM | POA: Diagnosis not present

## 2020-04-06 DIAGNOSIS — G8194 Hemiplegia, unspecified affecting left nondominant side: Secondary | ICD-10-CM | POA: Diagnosis not present

## 2020-04-06 DIAGNOSIS — K7460 Unspecified cirrhosis of liver: Secondary | ICD-10-CM | POA: Diagnosis not present

## 2020-04-06 DIAGNOSIS — R188 Other ascites: Secondary | ICD-10-CM | POA: Diagnosis not present

## 2020-04-06 DIAGNOSIS — R0902 Hypoxemia: Secondary | ICD-10-CM | POA: Diagnosis not present

## 2020-04-06 DIAGNOSIS — D649 Anemia, unspecified: Secondary | ICD-10-CM | POA: Diagnosis not present

## 2020-04-06 DIAGNOSIS — M6259 Muscle wasting and atrophy, not elsewhere classified, multiple sites: Secondary | ICD-10-CM | POA: Diagnosis not present

## 2020-04-06 DIAGNOSIS — I4891 Unspecified atrial fibrillation: Secondary | ICD-10-CM | POA: Diagnosis not present

## 2020-04-06 DIAGNOSIS — I6340 Cerebral infarction due to embolism of unspecified cerebral artery: Secondary | ICD-10-CM | POA: Diagnosis not present

## 2020-04-06 DIAGNOSIS — R5381 Other malaise: Secondary | ICD-10-CM | POA: Diagnosis not present

## 2020-04-06 DIAGNOSIS — I482 Chronic atrial fibrillation, unspecified: Secondary | ICD-10-CM | POA: Diagnosis not present

## 2020-04-06 DIAGNOSIS — R1312 Dysphagia, oropharyngeal phase: Secondary | ICD-10-CM | POA: Diagnosis not present

## 2020-04-06 DIAGNOSIS — R531 Weakness: Secondary | ICD-10-CM | POA: Diagnosis not present

## 2020-04-08 DIAGNOSIS — K7460 Unspecified cirrhosis of liver: Secondary | ICD-10-CM | POA: Diagnosis not present

## 2020-04-08 DIAGNOSIS — R5381 Other malaise: Secondary | ICD-10-CM | POA: Diagnosis not present

## 2020-04-08 DIAGNOSIS — I4891 Unspecified atrial fibrillation: Secondary | ICD-10-CM | POA: Diagnosis not present

## 2020-04-08 DIAGNOSIS — G8194 Hemiplegia, unspecified affecting left nondominant side: Secondary | ICD-10-CM | POA: Diagnosis not present

## 2020-04-12 DIAGNOSIS — R5381 Other malaise: Secondary | ICD-10-CM | POA: Diagnosis not present

## 2020-04-12 DIAGNOSIS — G8194 Hemiplegia, unspecified affecting left nondominant side: Secondary | ICD-10-CM | POA: Diagnosis not present

## 2020-04-12 DIAGNOSIS — K7460 Unspecified cirrhosis of liver: Secondary | ICD-10-CM | POA: Diagnosis not present

## 2020-04-12 DIAGNOSIS — D649 Anemia, unspecified: Secondary | ICD-10-CM | POA: Diagnosis not present

## 2020-05-23 DIAGNOSIS — K7460 Unspecified cirrhosis of liver: Secondary | ICD-10-CM | POA: Diagnosis not present

## 2020-06-13 DEATH — deceased
